# Patient Record
Sex: Male | Born: 1950 | Race: White | Hispanic: No | Marital: Single | Smoking: Current every day smoker
Health system: Southern US, Community
[De-identification: ages and names within clinical notes are randomized; demographics above are authoritative.]

## PROBLEM LIST (undated history)

## (undated) DIAGNOSIS — D0430 Carcinoma in situ of skin of unspecified part of face: Secondary | ICD-10-CM

## (undated) HISTORY — PX: OTHER SURGICAL HISTORY: SHX169

---

## 2004-08-13 ENCOUNTER — Inpatient Hospital Stay (HOSPITAL_COMMUNITY): Admission: EM | Admit: 2004-08-13 | Discharge: 2004-08-17 | Payer: Self-pay | Admitting: Emergency Medicine

## 2005-01-27 ENCOUNTER — Emergency Department (HOSPITAL_COMMUNITY): Admission: EM | Admit: 2005-01-27 | Discharge: 2005-01-28 | Payer: Self-pay | Admitting: Emergency Medicine

## 2010-03-29 ENCOUNTER — Emergency Department (HOSPITAL_COMMUNITY): Admission: EM | Admit: 2010-03-29 | Discharge: 2010-03-29 | Payer: Self-pay | Admitting: Emergency Medicine

## 2011-03-15 LAB — URINALYSIS, ROUTINE W REFLEX MICROSCOPIC
Glucose, UA: NEGATIVE mg/dL
Ketones, ur: 15 mg/dL — AB
Protein, ur: 100 mg/dL — AB
pH: 5 (ref 5.0–8.0)

## 2011-03-15 LAB — URINE MICROSCOPIC-ADD ON

## 2011-05-12 NOTE — Discharge Summary (Signed)
NAMEADRION, Randall Bauer                           ACCOUNT NO.:  [id number]   MEDICAL RECORD NO.:  [id number]                   PATIENT TYPE:  INP   LOCATION:  2004                                 FACILITY:  [facility]   PHYSICIAN:  Vesta Mixer, M.D.              DATE OF BIRTH:  14-Sep-1951   DATE OF ADMISSION:  08/13/2004  DATE OF DISCHARGE:  08/17/2004                                 DISCHARGE SUMMARY   DISCHARGE DIAGNOSES:  1. Acute anterior wall myocardial infarction.  2. 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  3. Bradycardia.   DISCHARGE MEDICATIONS:  1. Plavix 75 mg a day.  2. Nitroglycerin 0.4 mg sublingually as needed.  3. Lisinopril 5 mg a day.  4. Inspra 25 mg a day.  5. Enteric-coated aspirin 81 mg a day.  6. Coumadin 5 mg a day.   DISPOSITION:  The patient will see Dr. Elease Hashimoto in the office on Friday for a  pro time and a BMP.  He will see Dr. Elease Hashimoto in the office in one week from  now for a repeat pro time.   HISTORY:  Randall Bauer is a 60 year old gentleman who was admitted to the  hospital on August 13, 2004, with an acute anterior wall myocardial  infarction that had actually started several days previously.  Please see  dictated History and Physical for further details.   HOSPITAL COURSE:  CORONARY ARTERY DISEASE:  The patient was taken to the  catheterization lab and was found to have an occluded left anterior  descending artery.  He underwent successful PTCA and stenting of his left  anterior descending artery using a 3.0 x 20 mm Taxus.  The stent was post  dilated using a 3.0 Quantum Monorail.  We were successful in opening up the  obstruction to 0% stenosis.  The patient did quite well.  The left  ventriculogram did reveal an apical aneurysm.  He was started on Lovenox,  heparin, and Coumadin at that time.  The patient did quite well.  He was  also started on Inspra 25 mg a day.  We started him  initially on metoprolol  25 mg p.o. b.i.d., but the metoprolol was held because of his persistent  bradycardia.  We will anticipate restarting the metoprolol as an outpatient.   At the time of discharge, his INR is 1.5.  he will be discharged on the  above-noted medications.  We will check a pro time in two days.                                                Vesta Mixer, M.D.    PJN/MEDQ  D:  08/17/2004  T:  08/17/2004  Job:  [id number] 

## 2011-05-12 NOTE — Cardiovascular Report (Signed)
Randall Bauer, Randall Bauer               ACCOUNT NO.:  [id number]   MEDICAL RECORD NO.:  [id number]          PATIENT TYPE:  INP   LOCATION:  2004                         FACILITY:  [facility]   PHYSICIAN:  Vesta Mixer, M.D. DATE OF BIRTH:  1951/11/26   DATE OF PROCEDURE:  08/13/2004  DATE OF DISCHARGE:  08/17/2004                              CARDIAC CATHETERIZATION   INDICATIONS FOR PROCEDURE:  The patient is a 60 year old gentleman who  presented to the emergency room with acute onset of chest pain.  He had ST  segment elevation in the anterior leads consistent with an anterior wall  myocardial infarction.  He was brought to the catheterization lab for  emergent heart catheterization and angioplasty.   DESCRIPTION OF PROCEDURE:  The right femoral artery was easily cannulated  using the modified Seldinger technique.   HEMODYNAMICS:  LV pressure was 141/27.  The aortic pressure was 137/73.   ANGIOGRAPHY:  1.  Left main.  The left main was smooth and normal.  2.  Left anterior descending artery had a 99% stenosis just after the first      diagonal vessel.  There was a TIMI 1 flow down the LAD.  There was mild      irregularities in the diagonal branch between 20 and 25%.  3.  Left circumflex artery is a moderate size vessel.  This was fairly      tortuous.  It gives two large obtuse marginal branches, both of which      are normal.  4.  The right coronary artery is moderate to large in size.  It is dominant.      It gives off a fairly large posterior descending artery and a moderate      size posterolateral segment artery.  There are minor luminal      irregularities in the distal right coronary artery.  5.  Left ventriculogram was performed in a 30 RAO position.  It reveals      anterior apical akinesis/dyskinesis.  The ejection fraction is      approximately 30%.   PTCA:  The 6 French sheath was traded out for a 7 [place] sheath.  The left  main was engaged using a Judkins left 4, 7  [place] guide.  The patient  received IV heparin followed by a double bolus Integrilin drip.   A BMW wire was placed down into the distal left anterior descending artery.  This was followed by a 3.0 x 15 mm Quantum Monorail.  A total of five  inflations were performed at the site of the lesion:  3 atmospheres for 16  seconds, 8 atmospheres for 29 seconds, 8 atmospheres for 29 seconds, 10  atmospheres for 30 seconds, and 8 atmospheres for 21 seconds.  This resulted  in significant improvement of the vessel lumen.  At this point, a 3.0 x 20  mm TAXUS stent was positioned across the affected area.  It was deployed at  15 atmospheres for 40 seconds, a second inflation of 16 atmospheres for 30  seconds was performed.  This resulted in a marked improvement of  the vessel  lumen.  Post stent dilatation was achieved using the 3.0 x 15 mm Quantum  Maverick.  We positioned it inside the stent and inflated it twice up to 20  atmospheres, once in the distal end and once in the proximal end of the  stent.  This resulted in a very nice angiographic lumen.   COMPLICATIONS:  None.   CONCLUSION:  1.  Successful PTCA and stenting of the proximal left anterior descending      artery.  2.  He has significant left ventricular dysfunction with an anterior apical      akinesis/dyskinesis.  He will need to be on Coumadin for several months.       PJN/MEDQ  D:  12/05/2004  T:  12/05/2004  Job:  [id number]

## 2011-05-12 NOTE — H&P (Signed)
NAMEDAEMIAN, GAHM NO.:  [id number]   MEDICAL RECORD NO.:  [id number]          PATIENT TYPE:  INP   LOCATION:  2004                         FACILITY:  [facility]   PHYSICIAN:  Vesta Mixer, M.D. DATE OF BIRTH:  1951/04/23   DATE OF ADMISSION:  08/13/2004  DATE OF DISCHARGE:  08/17/2004                                HISTORY & PHYSICAL   Mr. Trulson is a 60 year old gentleman with no significant past medical  history.  He was transferred from [facility] with some episodes of  chest pain.  He had an EKG that was consistent with an anterior wall  myocardial infarction.   The patient stated that he had intermittent chest pain starting two weeks  ago.  The pain gradually worsened.  Yesterday morning the patient had severe  pain, and then this morning the pain became fairly constant.  He presented  to the [facility] and was found to have an ST segment elevation in  the anterior leads.  He was transferred to [facility] for further  evaluation.  The patient's chest pain is described as mid sternal and is  chest heaviness.  It radiates down to his left arm.   CURRENT MEDICATIONS:  None.   ALLERGIES:  None.   PAST MEDICAL HISTORY:  None.   SOCIAL HISTORY:  The patient smokes one pack a day.   FAMILY HISTORY:  Positive for coronary artery disease.   REVIEW OF SYSTEMS:  The patient has been relatively healthy.  Otherwise, the  Review of Systems is as noted in the HPI.   PHYSICAL EXAMINATION:  GENERAL:  He is a middle-aged gentleman in mild  distress.  VITAL SIGNS:  Heart rate was in the 80s and then came down to the 60s after  IV Lopressor. Blood pressure initially was 160/90 and then later came down  to 130/70 after IV Lopressor.  NECK:  He had 2+ carotids.  He had no JVD.  LUNGS:  Clear.  HEART:  Regular rate, S1, S2.  He had no S3 gallop.  ABDOMEN:  Good bowel sounds and was nontender.  There was no evidence of an  aneurysm.  EXTREMITIES:  He has good pulses.  He had no edema.  NEUROLOGIC:  Exam was nonfocal.   LABORATORY AND X-RAY DATA:  EKG revealed ST segment elevation in the  anteroseptal leads.  He did have small Q waves.   Laboratory data is pending.   IMPRESSION:  1.  Acute anteroseptal myocardial infarction.  The patient presents with      several days of unstable angina.  He developed constant chest pain      earlier this morning and now is found to have changes consistent with an      anterior wall myocardial infarction.   PLAN:  I have recommended that we proceed with an urgent heart  catheterization with possible angioplasty.  We have discussed the risks,  benefits, and options.  He understands and agrees to proceed.       PJN/MEDQ  D:  11/15/2004  T:  11/15/2004  Job:  [id number] 

## 2016-12-25 DIAGNOSIS — D0430 Carcinoma in situ of skin of unspecified part of face: Secondary | ICD-10-CM

## 2016-12-25 HISTORY — DX: Carcinoma in situ of skin of unspecified part of face: D04.30

## 2017-11-18 ENCOUNTER — Other Ambulatory Visit: Payer: Self-pay

## 2017-11-18 ENCOUNTER — Encounter (HOSPITAL_COMMUNITY): Payer: Self-pay | Admitting: *Deleted

## 2017-11-18 ENCOUNTER — Emergency Department (HOSPITAL_COMMUNITY)
Admission: EM | Admit: 2017-11-18 | Discharge: 2017-11-18 | Disposition: A | Discharge status: Home / Self Care | Payer: Medicare Other | Attending: Emergency Medicine | Admitting: Emergency Medicine

## 2017-11-18 ENCOUNTER — Emergency Department (HOSPITAL_COMMUNITY): Payer: Medicare Other

## 2017-11-18 DIAGNOSIS — F17210 Nicotine dependence, cigarettes, uncomplicated: Secondary | ICD-10-CM | POA: Diagnosis not present

## 2017-11-18 DIAGNOSIS — G8929 Other chronic pain: Secondary | ICD-10-CM | POA: Insufficient documentation

## 2017-11-18 DIAGNOSIS — M545 Low back pain: Secondary | ICD-10-CM | POA: Diagnosis present

## 2017-11-18 DIAGNOSIS — M5442 Lumbago with sciatica, left side: Secondary | ICD-10-CM | POA: Insufficient documentation

## 2017-11-18 MED ORDER — ACETAMINOPHEN 500 MG PO TABS: 1000.0000 mg | ORAL_TABLET | Freq: Once | ORAL | Status: DC

## 2017-11-18 MED ORDER — ACETAMINOPHEN 325 MG PO TABS
650.0000 mg | ORAL_TABLET | Freq: Once | ORAL | Status: AC
  Administered 2017-11-18: 650 mg via ORAL
  Filled 2017-11-18: qty 2

## 2017-11-18 MED ORDER — OXYCODONE HCL 5 MG PO TABS: 5.0000 mg | ORAL_TABLET | Freq: Four times a day (QID) | ORAL | 0 refills | Status: AC | PRN

## 2017-11-18 MED ORDER — OXYCODONE-ACETAMINOPHEN 5-325 MG PO TABS
1.0000 | ORAL_TABLET | Freq: Once | ORAL | Status: AC
  Administered 2017-11-18: 1 via ORAL
  Filled 2017-11-18: qty 1

## 2017-11-18 NOTE — ED Triage Notes (Addendum)
Pt states was seen at [place] for lower back pain, L sided.  Works at an [employer], but does not remember a specific injury.  Took prednisone and baclofen without relief.  Denies changes in bowel or bladder habits numbness. Is upset that no x-ray was taken at [facility].

## 2017-11-18 NOTE — Discharge Instructions (Signed)
I suspect your back pain is from disc degenerative changes and arthritis, this could be inflaming your sciatic nerve on the left side. Continue taking baclofen and prednisone as prescribed.  For mild/moderate pain take 1000 mg of Tylenol +600 mg of ibuprofen every 8 hours. For severe or breakthrough pain you can add 5 mg of oxycodone. You may find lidocaine patches helpful. Use heating pad. Rest and avoid any exacerbating activity for the next 2-3 days. Follow-up with orthopedist or primary care doctor in 1 week for reevaluation. If you may need an MRI for further evaluation.  Oxycodone is a narcotic pain medication that has risk of overdose, death, dependence and abuse. Mild and expected side effects include nausea, stomach upset, drowsiness, constipation. Do not consume alcohol, drive or use heavy machinery while taking this medication. Do not leave unattended around children. Flush any remaining pills that you do not use and do not share.  The emergency department has a strict policy regarding prescription of narcotic medications. We are unable to refill this medication in the emergency department for chronic pain. Contact your primary care provider or specialist for chronic pain management and refill on narcotic medications.   Return to the ED via develop fevers, chills, abdominal pain, groin pain, bladder/bowel incontinence or retention, numbness or weakness to her extremities.  See your x-ray results below   EXAM:  LUMBAR SPINE - COMPLETE 4+ VIEW     COMPARISON:  03/29/2010     FINDINGS:  There is no evidence of lumbar spine fracture. Alignment is normal.  There are discogenic degenerative changes, most prominent at L5-S1  with facet arthropathy. Otherwise, intervertebral disc spaces and  vertebral body heights are maintained.     IMPRESSION:  Discogenic degenerative changes without acute fracture or  subluxation.        Electronically Signed    By: Kristopher Oppenheim M.D.    On:  11/18/2017 13:03

## 2017-11-18 NOTE — ED Notes (Signed)
To x-ray

## 2017-11-18 NOTE — ED Provider Notes (Signed)
[place] EMERGENCY DEPARTMENT Provider Note   CSN: [id number] Arrival date & time: 11/18/17  1035     History   Chief Complaint Chief Complaint  Patient presents with  . Back Pain    HPI Randall Bauer is a 66 y.o. male presents with melanoma on cheek s/p excision presents to ED for evaluation of gradually worsening, non radiating, intermittent left lower back pain x 3 weeks. Pain is worse with sneezing, coughing, bending, sitting up and weight bearing. Was seen in [facility] three days ago for this, given baclofen and prednisone which have not helped, pain is getting worse. No x-rays done at [facility]. He denies fall, injury, previous surgeries to the area. Denies fevers, abdominal pain, urinary symptoms, bladder/bowel incontinence or retention, numbness to his extremities, groin or testicular pain. Has history of kidney stones in the past but states this does not feel the same.  HPI  Past Medical History:  Diagnosis Date  . Melanoma ([place]) 2018    There are no active problems to display for this patient.   Past Surgical History:  Procedure Laterality Date  . melanoma removal      OB History    No data available       Home Medications    Prior to Admission medications   Medication Sig Start Date End Date Taking? Authorizing Provider  oxyCODONE (ROXICODONE) 5 MG immediate release tablet Take 1 tablet (5 mg total) by mouth every 6 (six) hours as needed for up to 3 days for severe pain. 11/18/17 11/21/17  Kinnie Feil, PA-C    Family History No family history on file.  Social History Social History   Tobacco Use  . Smoking status: Current Every Day Smoker    Packs/day: 0.50    Types: Cigarettes  . Smokeless tobacco: Never Used  Substance Use Topics  . Alcohol use: No    Frequency: Never  . Drug use: No     Allergies   Patient has no known allergies.   Review of Systems Review of Systems  Musculoskeletal: Positive for back pain, gait  problem and myalgias.  All other systems reviewed and are negative.    Physical Exam Updated Vital Signs BP 136/86 (BP Location: Right Arm)   Pulse 76   Temp 98.7 F (37.1 C) (Oral)   Resp 16   Ht 5\' 10"  (1.778 m)   Wt 91.6 kg (202 lb)   SpO2 94%   BMI 28.98 kg/m   Physical Exam  Constitutional: She is oriented to person, place, and time. She appears well-developed and well-nourished. No distress.  HENT:  Head: Normocephalic and atraumatic.  Nose: Nose normal.  Eyes: EOM are normal.  Neck:  No midline cervical spine tenderness  Cardiovascular: Normal rate, S1 normal, S2 normal and normal heart sounds.  Pulses:      Radial pulses are 2+ on the right side, and 2+ on the left side.       Dorsalis pedis pulses are 2+ on the right side, and 2+ on the left side.  2+ DP, PT and radial pulses bilaterally  Pulmonary/Chest: Effort normal and breath sounds normal. She has no decreased breath sounds.  Abdominal: Soft. Normal appearance and bowel sounds are normal. There is no tenderness.  No suprapubic or CVA tenderness  Abdomen soft NTND  Musculoskeletal: She exhibits tenderness.       Lumbar back: She exhibits tenderness and pain.       Back:  No CTL spine midline  tenderness +Tenderness to left lumbar paraspinal muscles, SI joint and sciatic notch Full PROM hip bilaterally without reported pain Negative SLR. Negative Faber. Negative Stinchfield test.   Neurological: She is alert and oriented to person, place, and time.  5/5 strength with flexion/extension of hip, knee and ankle, bilaterally.  Sensation to light touch intact in lower extremities including feet 2+ patellar reflexes bilaterally  Skin: Skin is warm and dry. Capillary refill takes less than 2 seconds.  Psychiatric: She has a normal mood and affect. Her speech is normal and behavior is normal. Judgment and thought content normal. Cognition and memory are normal.  Nursing note and vitals reviewed.    ED Treatments  / Results  Labs (all labs ordered are listed, but only abnormal results are displayed) Labs Reviewed - No data to display  EKG  EKG Interpretation None       Radiology Dg Lumbar Spine Complete  Result Date: 11/18/2017 CLINICAL DATA:  66 year old male with lower back and right hip pain, worsening for 3 weeks. EXAM: LUMBAR SPINE - COMPLETE 4+ VIEW COMPARISON:  03/29/2010 FINDINGS: There is no evidence of lumbar spine fracture. Alignment is normal. There are discogenic degenerative changes, most prominent at L5-S1 with facet arthropathy. Otherwise, intervertebral disc spaces and vertebral body heights are maintained. IMPRESSION: Discogenic degenerative changes without acute fracture or subluxation. Electronically Signed   By: Kristopher Oppenheim M.D.   On: 11/18/2017 13:03   Dg Hip Unilat W Or Wo Pelvis 2-3 Views Right  Result Date: 11/18/2017 CLINICAL DATA:  66 year old male with lower back and right hip pain for 3 weeks, worsening. EXAM: DG HIP (WITH OR WITHOUT PELVIS) 2-3V RIGHT COMPARISON:  None. FINDINGS: There is no evidence of hip fracture or dislocation. There are mild degenerative changes. No other focal bone abnormality. Visualized soft tissue structures and visceral contours grossly unremarkable. IMPRESSION: No acute fracture or dislocation. Electronically Signed   By: Kristopher Oppenheim M.D.   On: 11/18/2017 13:01    Procedures Procedures (including critical care time)  Medications Ordered in ED Medications  oxyCODONE-acetaminophen (PERCOCET/ROXICET) 5-325 MG per tablet 1 tablet (1 tablet Oral Given 11/18/17 1155)  acetaminophen (TYLENOL) tablet 650 mg (650 mg Oral Given 11/18/17 1155)     Initial Impression / Assessment and Plan / ED Course  I have reviewed the triage vital signs and the nursing notes.  Pertinent labs & imaging results that were available during my care of the patient were reviewed by me and considered in my medical decision making (see chart for  details).    Patient is a 66 y.o. male presents to the ED with left lower back pain.  On exam pt has VSS, abdominal exam reassuring without suprapubic or CVAT. Distal pulses symmetric bilaterally.  Musculoskeletal exam revealed focal left lumbar paraspinal muscle and sciatic notch tenderness. Negative SLR.    Considered kidney stone, cauda equina or epidural abscess however these don't fit clinical picture. Most likely lumbar strain vs spasm vs DDD vs sciatica. No bladder/bowel incontinence or retention, night sweats, night pain, fevers or weight loss, IVDU, recent trauma or falls, urinary symptoms. Lab work not indicated today. Imaging not indicated today as physical exam reassuring.   X-ray show DDD and arthropathy.   Final Clinical Impressions(s) / ED Diagnoses    Pt had adequate control of pain with percocet and APAP in ED. He is ambulatory. Discussed x-ray with pt and family. He will be d/c with prednisone, high dose NSAIDs, oxy, muscle relaxer, lidocaine patches for multi  modal pain control. He is to f/u with ortho in 1 week for re-eval. Discussed return precautions. Final diagnoses:  Chronic left-sided low back pain with left-sided sciatica    ED Discharge Orders        Ordered    oxyCODONE (ROXICODONE) 5 MG immediate release tablet  Every 6 hours PRN     11/18/17 1331       Kinnie Feil, [place] 11/18/17 1336    Julianne Rice, MD 11/18/17 [phone]

## 2017-11-22 ENCOUNTER — Ambulatory Visit (INDEPENDENT_AMBULATORY_CARE_PROVIDER_SITE_OTHER): Payer: Medicare Other | Admitting: Family

## 2017-11-22 ENCOUNTER — Encounter (INDEPENDENT_AMBULATORY_CARE_PROVIDER_SITE_OTHER): Payer: Self-pay | Admitting: Family

## 2017-11-22 DIAGNOSIS — M545 Low back pain, unspecified: Secondary | ICD-10-CM

## 2017-11-22 MED ORDER — OXYCODONE-ACETAMINOPHEN 5-325 MG PO TABS: 1.0000 | ORAL_TABLET | Freq: Four times a day (QID) | ORAL | 0 refills | Status: DC | PRN

## 2017-11-22 MED ORDER — NAPROXEN 500 MG PO TABS: 500.0000 mg | ORAL_TABLET | Freq: Two times a day (BID) | ORAL | 0 refills | Status: AC | PRN

## 2017-11-22 NOTE — Progress Notes (Signed)
Office Visit Note   Patient: Randall Bauer           Date of Birth: 05-30-1951           MRN: [id number] Visit Date: 11/22/2017              Requested by: No referring provider defined for this encounter. PCP: Patient, No Pcp Per  Chief Complaint  Patient presents with  . Lower Back - Pain      HPI: Patient is a 66 year old gentleman who presents today complaining of 5-week history of left-sided low back pain.  He has been seen by's estimated urgent care as well as the emergency department has tried muscle relaxers and 600 mg of ibuprofen with minimal relief.  Visit to the emergency department was prescribed prednisone as well as oxycodone.  Has had minimal relief with the prednisone.  After completing course had return of pain.  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  Had difficulty sleeping last night due to pain denies any numbness tingling weakness. has only had pain down his left leg on one occurrence.   Assessment & Plan: Visit Diagnoses: No diagnosis found.  Plan: Follow up in office in 4 weeks to reevaluate. Will order PT. Refill of Percocet for once occurrence. Will also have him take 2 weeks of Naproxen.   Follow-Up Instructions: No Follow-up on file.   Back Exam   Tenderness  The patient is experiencing no tenderness.   Range of Motion  The patient has normal back ROM.  Muscle Strength  The patient has normal back strength.  Tests  Straight leg raise right: negative Straight leg raise left: negative  Other  Gait: normal   Comments:  No spinous process tenderness. Left sided low back soft tissue tenderness.      Patient is alert, oriented, no adenopathy, well-dressed, normal affect, normal respiratory effort.   Imaging: No results found. No images are attached to the encounter.  Labs: No results  found for: HGBA1C, ESRSEDRATE, CRP, LABURIC, REPTSTATUS, GRAMSTAIN, CULT, LABORGA  @LABSALLVALUES (HGBA1)@  @BMI1 @  Orders:  No orders of the defined types were placed in this encounter.  Meds ordered this encounter  Medications  . oxyCODONE-acetaminophen (ROXICET) 5-325 MG tablet    Sig: Take 1 tablet by mouth every 6 (six) hours as needed for severe pain.    Dispense:  30 tablet    Refill:  0  . naproxen (NAPROSYN) 500 MG tablet    Sig: Take 1 tablet (500 mg total) by mouth 2 (two) times daily as needed for mild pain or moderate pain. With meals    Dispense:  60 tablet    Refill:  0     Procedures: No procedures performed  Clinical Data: No additional findings.  ROS:  All other systems negative, except as noted in the HPI. Review of Systems  Constitutional: Negative for chills and fever.  Musculoskeletal: Positive for back pain and myalgias.  Neurological: Negative for weakness and numbness.    Objective: Vital Signs: There were no vitals taken for this visit.  Specialty Comments:  No specialty comments available.  PMFS History: There are no active problems to display for this patient.  Past Medical History:  Diagnosis Date  . Melanoma ([place]) 2018    No family history on file.  Past Surgical History:  Procedure Laterality Date  . melanoma removal     Social History   Occupational History  . Not on file  Tobacco Use  . Smoking status: Current Every Day Smoker    Packs/day: 0.50    Types: Cigarettes  . Smokeless tobacco: Never Used  Substance and Sexual Activity  . Alcohol use: No    Frequency: Never  . Drug use: No  . Sexual activity: Not on file

## 2017-11-27 ENCOUNTER — Ambulatory Visit: Payer: Medicare Other | Attending: Family | Admitting: Physical Therapy

## 2017-11-27 DIAGNOSIS — M545 Low back pain, unspecified: Secondary | ICD-10-CM

## 2017-11-27 DIAGNOSIS — M6281 Muscle weakness (generalized): Secondary | ICD-10-CM | POA: Diagnosis present

## 2017-11-27 DIAGNOSIS — R262 Difficulty in walking, not elsewhere classified: Secondary | ICD-10-CM

## 2017-11-27 NOTE — Patient Instructions (Signed)
Jari Dipasquale PT [facility] [address], [address] [place], [place] [address] Phone # [phone] Fax [phone]    

## 2017-11-27 NOTE — Therapy (Addendum)
[facility] [address], [place], [place], [address] Phone: [phone]   Fax:  [phone]  Physical Therapy Evaluation/Discharge Summary  Patient Details  Name: Randall Bauer MRN: [id number] Date of Birth: 03/17/1951 Referring Provider: Dondra Prader NP   Encounter Date: 11/27/2017  PT End of Session - 11/27/17 1933    Visit Number  1    Number of Visits  10    Date for PT Re-Evaluation  01/22/18    Authorization Type  [employer] Medicare:  G codes at visit 10; [facility] at visit 15    PT Start Time  1615    PT Stop Time  1705    PT Time Calculation (min)  50 min    Activity Tolerance  Patient limited by pain       Past Medical History:  Diagnosis Date  . Melanoma ([place]) 2018    Past Surgical History:  Procedure Laterality Date  . melanoma removal      There were no vitals filed for this visit.   Subjective Assessment - 11/27/17 1624    Subjective  5-6 weeks ago LBP started after moving and lifting a TV set in an awkward fashion.  Pain immediately radiated left LE to toes.  Went to ER b/c I could barely walk.  Today is a little different b/c the pain has switched sides to the right.  Patient states he has not been eating much lately.  Difficulty sleeping.  Patient's voice is very hoarse.  He reports he has had chest congestion about a week.      Pertinent History  no previous hx    Limitations  House hold activities;Walking;Sitting    How long can you sit comfortably?  20 min    How long can you walk comfortably?  here to the door    Diagnostic tests  none     Patient Stated Goals  be able to walk without pain;  sleep through the night    Currently in Pain?  Yes    Pain Score  4     Pain Location  Back    Pain Orientation  Right;Left    Pain Type  Acute pain    Pain Radiating Towards  left LE to toes    Pain Onset  More than a month ago    Pain Frequency  Constant    Aggravating Factors    walking, sitting,  coughing, bending forward, sit to stand, turning in bed    Pain Relieving Factors  oxycodone; sit in my car         [facility] PT Assessment - 11/27/17 0001      Assessment   Medical Diagnosis  acute left sided low back pain without sciatica    Referring Provider  Dondra Prader NP    Onset Date/Surgical Date  -- 5-6 weeks    Next MD Visit  not scheduled      Precautions   Precautions  None      Restrictions   Weight Bearing Restrictions  No      Balance Screen   Has the patient fallen in the past 6 months  No    Has the patient had a decrease in activity level because of a fear of falling?   No    Is the patient reluctant to leave their home because of a fear of falling?   No      Home Film/video editor residence  Living Arrangements  Spouse/significant other    Type of [place] to enter    Entrance Stairs-Number of Steps  2    Home Layout  One level      Prior Function   Level of Independence  Independent with basic ADLs    Vocation  Full time employment    Vocation Requirements  [employer] lot of walking, getting in/out of cars    Leisure  just work; video games      Observation/Other Assessments   Focus on Therapeutic Outcomes (FOTO)   70% limitation       Posture/Postural Control   Posture/Postural Control  Postural limitations    Postural Limitations  Decreased lumbar lordosis      AROM   AROM Assessment Site  -- possible directional preference wtih extension in lying    Lumbar Flexion  5 pain    Lumbar Extension  10    Lumbar - Right Side Bend  20    Lumbar - Left Side Bend  10 pain      Strength   Strength Assessment Site  -- LE strength WFLs and symmetrical    Lumbar Flexion  4/5    Lumbar Extension  4/5      Flexibility   Soft Tissue Assessment /Muscle Length  yes    Hamstrings  70 degrees bil      Palpation   Palpation comment  right gluteal/piriformis      Slump test   Findings  Negative       Prone Knee Bend Test   Findings  Negative      Straight Leg Raise   Findings  Positive    Side   Right      Ambulation/Gait   Gait Comments  slow rise with sit to stand;  slow antalgic gait on arrival              Objective measurements completed on examination: See above findings.              PT Education - 11/27/17 1929    Education provided  Yes    Education Details  trial of extension every 2 hours; frequent change of position; avoid excessive flexion    Person(s) Educated  Patient    Methods  Explanation;Demonstration;Handout    Comprehension  Returned demonstration;Verbalized understanding       PT Short Term Goals - 11/27/17 2100      PT SHORT TERM GOAL #1   Title  The patient will demonstrate understanding of basic self care strategies for pain control and centralization of symptoms    Time  4    Target Date  12/25/17      PT SHORT TERM GOAL #2   Title  The patient will report a 30% improvement in back and LE symptoms    Time  4    Period  Weeks    Status  New      PT SHORT TERM GOAL #3   Title  The patient will be able to walk 300 feet for community and work ambulation     Time  4    Period  Weeks    Status  New        PT Long Term Goals - 11/27/17 2102      PT LONG TERM GOAL #1   Title  The patient will be independent in safe self progression of HEP  Time  8    Period  Weeks    Status  New    Target Date  01/22/18      PT LONG TERM GOAL #2   Title  The patient will report a 60% improvement in back and LE symptoms with usual ADLs including sit to stand, turning in bed    Time  8    Period  Weeks    Status  New      PT LONG TERM GOAL #3   Title  The patient will have improved lumbar ROM for flexion, extension and sidebending WFLs needed to get in/out of the car frequently needed for work     Time  8    Period  Weeks    Status  New      PT LONG TERM GOAL #4   Title  The patient will be able to walk 500 feet needed for work  duties    Time  8    Period  Weeks    Status  New      PT LONG TERM GOAL #5   Title  FOTO functional outcome score improved from 70% limitation to 44% indicating improved function with less pain    Time  8    Period  Weeks    Status  New             Plan - 11/27/17 1933    Clinical Impression Statement  The patient arrives moving slowly and antalgically.  He has a 5-6 week history of left low back pain which he states radiated down his left leg.  He reports his symptoms are different today b/c his pain has switched to the right side.  His symptoms began when he was moving and he lifted a TV awkwardly.  His pain is aggravated with walking, bending forward, sit to stand, prolonged sitting and turning in bed affecting his sleep.  He reports he is better with taking oxycodone.  Painful and limited lumbar ROM especially flexion and left sidebending.  Repeated movement testing indicates a possible preference for extension in lying.  Decreased lumbar lordosis.  + right SLR but other neural tension signs -.   He has been unable to work at his job at [employer] where he does a lot of walking and driving.   He would benefit from PT for pain control and to restore normal movement and function.  Following session, the patient is able to rise from sit to stand with greater ease and walk upright at a normal speed.   Recommend 2x/week however the patient states he cannot afford his high co-pay and requests 1x/week.      History and Personal Factors relevant to plan of care:  no co-morbidities; no previous history    Clinical Presentation  Evolving    Clinical Presentation due to:  distal and changing symptoms    Clinical Decision Making  Low    Rehab Potential  Good    Clinical Impairments Affecting Rehab Potential  none    PT Frequency  2x / week patient requests 1x/week for financial reasons    PT Duration  8 weeks    PT Treatment/Interventions  ADLs/Self Care Home Management;Neuromuscular  re-education;Dry needling;Moist Heat;Manual techniques;Taping;Patient/family education;Traction;Ultrasound;Therapeutic activities;Therapeutic exercise;Electrical Stimulation;Cryotherapy    PT Next Visit Plan  assess response to extension trial and progress per [place] system;   electrical stimulation/moist heat for pain control;  manual therapy; mobility exercise       Patient will  benefit from skilled therapeutic intervention in order to improve the following deficits and impairments:  Pain, Postural dysfunction, Decreased range of motion, Decreased strength, Difficulty walking, Decreased activity tolerance  Visit Diagnosis: Acute bilateral low back pain without sciatica - Plan: PT plan of care cert/re-cert  Difficulty in walking, not elsewhere classified - Plan: PT plan of care cert/re-cert  Muscle weakness (generalized) - Plan: PT plan of care cert/re-cert  G-Codes - 64/15/83 February 21, 2108    Functional Assessment Tool Used (Outpatient Only)  FOTO; clinical impression    Functional Limitation  Mobility: Walking and moving around    Mobility: Walking and Moving Around Current Status [phone])  At least 60 percent but less than 80 percent impaired, limited or restricted    Mobility: Walking and Moving Around Goal Status [phone])  At least 40 percent but less than 60 percent impaired, limited or restricted       PHYSICAL THERAPY DISCHARGE SUMMARY  Visits from Start of Care: 1  Current functional level related to goals / functional outcomes: The patient did not return after initial evaluation 11/27/17.  Record review indicates he is now deceased.     Remaining deficits:    Education / Equipment:  Plan:                                                    Patient goals were not met.                                                                                                      ?????        Problem List There are no active problems to display for this patient.   Ruben Im,  PT 11/27/17 9:12 PM Phone: [phone] Fax: [phone]  Alvera Singh 11/27/2017, 9:12 PM   [facility] [address] [place], [place], [address] Phone: [phone]   Fax:  [phone]  Name: Homar Weinkauf MRN: [id number] Date of Birth: [birth year]

## 2017-12-07 ENCOUNTER — Ambulatory Visit (INDEPENDENT_AMBULATORY_CARE_PROVIDER_SITE_OTHER): Payer: Medicare Other | Admitting: Family

## 2017-12-07 ENCOUNTER — Telehealth (INDEPENDENT_AMBULATORY_CARE_PROVIDER_SITE_OTHER): Payer: Self-pay | Admitting: Orthopedic Surgery

## 2017-12-07 ENCOUNTER — Encounter (INDEPENDENT_AMBULATORY_CARE_PROVIDER_SITE_OTHER): Payer: Self-pay | Admitting: Family

## 2017-12-07 ENCOUNTER — Ambulatory Visit (INDEPENDENT_AMBULATORY_CARE_PROVIDER_SITE_OTHER): Payer: Self-pay

## 2017-12-07 DIAGNOSIS — W19XXXA Unspecified fall, initial encounter: Secondary | ICD-10-CM | POA: Diagnosis not present

## 2017-12-07 DIAGNOSIS — M545 Low back pain: Secondary | ICD-10-CM | POA: Diagnosis not present

## 2017-12-07 MED ORDER — OXYCODONE-ACETAMINOPHEN 5-325 MG PO TABS: 1.0000 | ORAL_TABLET | Freq: Three times a day (TID) | ORAL | 0 refills | Status: AC | PRN

## 2017-12-07 NOTE — Progress Notes (Signed)
Office Visit Note   Patient: Randall Bauer           Date of Birth: 23-Apr-1951           MRN: [id number] Visit Date: 12/07/2017              Requested by: No referring provider defined for this encounter. PCP: Patient, No Pcp Per  No chief complaint on file.     HPI: Patient is a 66 year old gentleman who presents today for a concern of worsening of back pain. This has been ongoing for nearly 2 months. Did have another fall in his home, mechanical fall, onto his left side. Low back pain on left sometimes radiates to right side. Now having constant aching pain down inner left groin to foot. Feels his right leg is stronger than left.   States laying on his left side improves his pain. trys to arch his back to relieve his pain as well. Has had one visit to PT since our last visit which was minimally helpful per report.   Did take a pred taper starting on 11/29 which helped a little. Feels the Naproxen does not help his radicular symptoms.   Assessment & Plan: Visit Diagnoses:  1. Left low back pain, unspecified chronicity, with sciatica presence unspecified   2. Fall, initial encounter     Plan: Follow up in office in 4 weeks to reevaluate. No red flag symptoms. Offered MRI in hopes to get in for [facility]. Patient declined. One time Rx for Percocet. Discussed if symptoms do not resolve before next appointment will need to proceed with MRI, no refill of narcotics. Encouraged him to continue with PT and Naproxen. Heating pad.   Follow-Up Instructions: Return in about 4 weeks (around 01/04/2018).   Back Exam   Range of Motion  The patient has normal back ROM.  Tests  Straight leg raise right: negative Straight leg raise left: negative  Other  Gait: normal   Comments:  No spinous process tenderness. Left sided low back soft tissue tenderness. Poor effort with quad strength testing.       Patient is alert, oriented, no adenopathy, well-dressed, normal affect, normal respiratory  effort.   Imaging: No results found. No images are attached to the encounter.  Labs: No results found for: HGBA1C, ESRSEDRATE, CRP, LABURIC, REPTSTATUS, GRAMSTAIN, CULT, LABORGA  @LABSALLVALUES (HGBA1)@  @BMI1 @  Orders:  Orders Placed This Encounter  Procedures  . XR Lumbar Spine 2-3 Views   Meds ordered this encounter  Medications  . oxyCODONE-acetaminophen (ROXICET) 5-325 MG tablet    Sig: Take 1 tablet by mouth every 8 (eight) hours as needed for severe pain.    Dispense:  21 tablet    Refill:  0     Procedures: No procedures performed  Clinical Data: No additional findings.  ROS:  All other systems negative, except as noted in the HPI. Review of Systems  Constitutional: Negative for chills and fever.  Musculoskeletal: Positive for back pain and myalgias.  Neurological: Positive for weakness. Negative for numbness.    Objective: Vital Signs: There were no vitals taken for this visit.  Specialty Comments:  No specialty comments available.  PMFS History: There are no active problems to display for this patient.  Past Medical History:  Diagnosis Date  . Melanoma ([place]) 2018    History reviewed. No pertinent family history.  Past Surgical History:  Procedure Laterality Date  . melanoma removal     Social History   Occupational History  .  Not on file  Tobacco Use  . Smoking status: Current Every Day Smoker    Packs/day: 0.50    Types: Cigarettes  . Smokeless tobacco: Never Used  Substance and Sexual Activity  . Alcohol use: No    Frequency: Never  . Drug use: No  . Sexual activity: Not on file

## 2017-12-07 NOTE — Telephone Encounter (Signed)
Med refill  Oxycodone-acetaminophen (Roxicet) 5-325 mg tablet

## 2017-12-22 ENCOUNTER — Encounter (HOSPITAL_COMMUNITY): Payer: Self-pay

## 2017-12-22 ENCOUNTER — Emergency Department (HOSPITAL_COMMUNITY): Payer: Medicare Other

## 2017-12-22 ENCOUNTER — Inpatient Hospital Stay (HOSPITAL_COMMUNITY)
Admission: EM | Admit: 2017-12-22 | Discharge: 2018-01-25 | DRG: 180 | Disposition: E | Payer: Medicare Other | Attending: Internal Medicine | Admitting: Internal Medicine

## 2017-12-22 ENCOUNTER — Inpatient Hospital Stay (HOSPITAL_COMMUNITY): Payer: Medicare Other

## 2017-12-22 ENCOUNTER — Other Ambulatory Visit: Payer: Self-pay

## 2017-12-22 DIAGNOSIS — C7931 Secondary malignant neoplasm of brain: Secondary | ICD-10-CM | POA: Diagnosis present

## 2017-12-22 DIAGNOSIS — E872 Acidosis, unspecified: Secondary | ICD-10-CM

## 2017-12-22 DIAGNOSIS — M8448XA Pathological fracture, other site, initial encounter for fracture: Secondary | ICD-10-CM | POA: Diagnosis present

## 2017-12-22 DIAGNOSIS — F17210 Nicotine dependence, cigarettes, uncomplicated: Secondary | ICD-10-CM | POA: Diagnosis present

## 2017-12-22 DIAGNOSIS — R64 Cachexia: Secondary | ICD-10-CM | POA: Diagnosis present

## 2017-12-22 DIAGNOSIS — T380X5A Adverse effect of glucocorticoids and synthetic analogues, initial encounter: Secondary | ICD-10-CM | POA: Diagnosis not present

## 2017-12-22 DIAGNOSIS — C3402 Malignant neoplasm of left main bronchus: Principal | ICD-10-CM | POA: Diagnosis present

## 2017-12-22 DIAGNOSIS — C7949 Secondary malignant neoplasm of other parts of nervous system: Secondary | ICD-10-CM | POA: Diagnosis present

## 2017-12-22 DIAGNOSIS — Z7189 Other specified counseling: Secondary | ICD-10-CM | POA: Diagnosis not present

## 2017-12-22 DIAGNOSIS — M545 Low back pain, unspecified: Secondary | ICD-10-CM

## 2017-12-22 DIAGNOSIS — Z66 Do not resuscitate: Secondary | ICD-10-CM | POA: Diagnosis present

## 2017-12-22 DIAGNOSIS — E871 Hypo-osmolality and hyponatremia: Secondary | ICD-10-CM | POA: Diagnosis present

## 2017-12-22 DIAGNOSIS — C7951 Secondary malignant neoplasm of bone: Secondary | ICD-10-CM | POA: Diagnosis present

## 2017-12-22 DIAGNOSIS — R0602 Shortness of breath: Secondary | ICD-10-CM | POA: Diagnosis not present

## 2017-12-22 DIAGNOSIS — C799 Secondary malignant neoplasm of unspecified site: Secondary | ICD-10-CM | POA: Diagnosis not present

## 2017-12-22 DIAGNOSIS — I248 Other forms of acute ischemic heart disease: Secondary | ICD-10-CM | POA: Diagnosis present

## 2017-12-22 DIAGNOSIS — Z85820 Personal history of malignant melanoma of skin: Secondary | ICD-10-CM | POA: Diagnosis not present

## 2017-12-22 DIAGNOSIS — J90 Pleural effusion, not elsewhere classified: Secondary | ICD-10-CM

## 2017-12-22 DIAGNOSIS — J439 Emphysema, unspecified: Secondary | ICD-10-CM | POA: Diagnosis present

## 2017-12-22 DIAGNOSIS — E876 Hypokalemia: Secondary | ICD-10-CM | POA: Diagnosis present

## 2017-12-22 DIAGNOSIS — Z515 Encounter for palliative care: Secondary | ICD-10-CM

## 2017-12-22 DIAGNOSIS — C7972 Secondary malignant neoplasm of left adrenal gland: Secondary | ICD-10-CM | POA: Diagnosis present

## 2017-12-22 DIAGNOSIS — C7982 Secondary malignant neoplasm of genital organs: Secondary | ICD-10-CM | POA: Diagnosis present

## 2017-12-22 DIAGNOSIS — J189 Pneumonia, unspecified organism: Secondary | ICD-10-CM

## 2017-12-22 DIAGNOSIS — F411 Generalized anxiety disorder: Secondary | ICD-10-CM

## 2017-12-22 DIAGNOSIS — I2510 Atherosclerotic heart disease of native coronary artery without angina pectoris: Secondary | ICD-10-CM

## 2017-12-22 DIAGNOSIS — M6289 Other specified disorders of muscle: Secondary | ICD-10-CM

## 2017-12-22 DIAGNOSIS — R739 Hyperglycemia, unspecified: Secondary | ICD-10-CM | POA: Diagnosis not present

## 2017-12-22 DIAGNOSIS — I10 Essential (primary) hypertension: Secondary | ICD-10-CM | POA: Diagnosis present

## 2017-12-22 DIAGNOSIS — G893 Neoplasm related pain (acute) (chronic): Secondary | ICD-10-CM | POA: Diagnosis present

## 2017-12-22 DIAGNOSIS — D72829 Elevated white blood cell count, unspecified: Secondary | ICD-10-CM

## 2017-12-22 DIAGNOSIS — J9811 Atelectasis: Secondary | ICD-10-CM

## 2017-12-22 DIAGNOSIS — R778 Other specified abnormalities of plasma proteins: Secondary | ICD-10-CM

## 2017-12-22 DIAGNOSIS — M898X9 Other specified disorders of bone, unspecified site: Secondary | ICD-10-CM

## 2017-12-22 DIAGNOSIS — C7889 Secondary malignant neoplasm of other digestive organs: Secondary | ICD-10-CM | POA: Diagnosis present

## 2017-12-22 DIAGNOSIS — R490 Dysphonia: Secondary | ICD-10-CM | POA: Diagnosis present

## 2017-12-22 DIAGNOSIS — I4892 Unspecified atrial flutter: Secondary | ICD-10-CM | POA: Diagnosis present

## 2017-12-22 DIAGNOSIS — J9601 Acute respiratory failure with hypoxia: Secondary | ICD-10-CM | POA: Diagnosis not present

## 2017-12-22 DIAGNOSIS — E279 Disorder of adrenal gland, unspecified: Secondary | ICD-10-CM

## 2017-12-22 DIAGNOSIS — C7970 Secondary malignant neoplasm of unspecified adrenal gland: Secondary | ICD-10-CM | POA: Diagnosis present

## 2017-12-22 DIAGNOSIS — R1909 Other intra-abdominal and pelvic swelling, mass and lump: Secondary | ICD-10-CM | POA: Diagnosis not present

## 2017-12-22 DIAGNOSIS — R042 Hemoptysis: Secondary | ICD-10-CM | POA: Diagnosis present

## 2017-12-22 DIAGNOSIS — M549 Dorsalgia, unspecified: Secondary | ICD-10-CM | POA: Diagnosis present

## 2017-12-22 DIAGNOSIS — E860 Dehydration: Secondary | ICD-10-CM | POA: Diagnosis present

## 2017-12-22 DIAGNOSIS — M899 Disorder of bone, unspecified: Secondary | ICD-10-CM | POA: Diagnosis not present

## 2017-12-22 DIAGNOSIS — R7989 Other specified abnormal findings of blood chemistry: Secondary | ICD-10-CM

## 2017-12-22 DIAGNOSIS — R918 Other nonspecific abnormal finding of lung field: Secondary | ICD-10-CM | POA: Diagnosis not present

## 2017-12-22 DIAGNOSIS — Z955 Presence of coronary angioplasty implant and graft: Secondary | ICD-10-CM

## 2017-12-22 DIAGNOSIS — D630 Anemia in neoplastic disease: Secondary | ICD-10-CM | POA: Diagnosis present

## 2017-12-22 DIAGNOSIS — Z6828 Body mass index (BMI) 28.0-28.9, adult: Secondary | ICD-10-CM

## 2017-12-22 DIAGNOSIS — J181 Lobar pneumonia, unspecified organism: Secondary | ICD-10-CM | POA: Diagnosis not present

## 2017-12-22 DIAGNOSIS — Z7952 Long term (current) use of systemic steroids: Secondary | ICD-10-CM | POA: Diagnosis not present

## 2017-12-22 DIAGNOSIS — I252 Old myocardial infarction: Secondary | ICD-10-CM

## 2017-12-22 DIAGNOSIS — E278 Other specified disorders of adrenal gland: Secondary | ICD-10-CM

## 2017-12-22 HISTORY — DX: Carcinoma in situ of skin of unspecified part of face: D04.30

## 2017-12-22 LAB — PROTIME-INR
INR: 1.29
PROTHROMBIN TIME: 16 s — AB (ref 11.4–15.2)

## 2017-12-22 LAB — URINALYSIS, ROUTINE W REFLEX MICROSCOPIC
Bilirubin Urine: NEGATIVE
GLUCOSE, UA: 50 mg/dL — AB
Ketones, ur: 20 mg/dL — AB
NITRITE: NEGATIVE
PH: 6 (ref 5.0–8.0)
Protein, ur: 30 mg/dL — AB
Specific Gravity, Urine: 1.036 — ABNORMAL HIGH (ref 1.005–1.030)

## 2017-12-22 LAB — CBC WITH DIFFERENTIAL/PLATELET
BASOS ABS: 0 10*3/uL (ref 0.0–0.1)
Basophils Relative: 0 %
EOS ABS: 0.3 10*3/uL (ref 0.0–0.7)
Eosinophils Relative: 1 %
HCT: 36.7 % — ABNORMAL LOW (ref 39.0–52.0)
HEMOGLOBIN: 12.5 g/dL — AB (ref 13.0–17.0)
LYMPHS PCT: 13 %
Lymphs Abs: 3.3 10*3/uL (ref 0.7–4.0)
MCH: 28.2 pg (ref 26.0–34.0)
MCHC: 34.1 g/dL (ref 30.0–36.0)
MCV: 82.7 fL (ref 78.0–100.0)
Monocytes Absolute: 2.8 10*3/uL — ABNORMAL HIGH (ref 0.1–1.0)
Monocytes Relative: 11 %
NEUTROS ABS: 18.7 10*3/uL — AB (ref 1.7–7.7)
NEUTROS PCT: 75 %
Platelets: 428 10*3/uL — ABNORMAL HIGH (ref 150–400)
RBC: 4.44 MIL/uL (ref 4.22–5.81)
RDW: 14.3 % (ref 11.5–15.5)
WBC: 25.1 10*3/uL — AB (ref 4.0–10.5)

## 2017-12-22 LAB — CREATININE, SERUM
CREATININE: 0.64 mg/dL (ref 0.61–1.24)
GFR calc Af Amer: >60 mL/min (ref >60)

## 2017-12-22 LAB — ABO/RH: ABO/RH(D): O POS

## 2017-12-22 LAB — COMPREHENSIVE METABOLIC PANEL
ALK PHOS: 123 U/L (ref 38–126)
ALT: 34 U/L (ref 17–63)
ANION GAP: 19 — AB (ref 5–15)
AST: 46 U/L — ABNORMAL HIGH (ref 15–41)
Albumin: 2.5 g/dL — ABNORMAL LOW (ref 3.5–5.0)
BILIRUBIN TOTAL: 1.3 mg/dL — AB (ref 0.3–1.2)
BUN: 16 mg/dL (ref 6–20)
CALCIUM: 9.4 mg/dL (ref 8.9–10.3)
CO2: 22 mmol/L (ref 22–32)
Chloride: 89 mmol/L — ABNORMAL LOW (ref 101–111)
Creatinine, Ser: 0.86 mg/dL (ref 0.61–1.24)
GLUCOSE: 169 mg/dL — AB (ref 65–99)
Potassium: 3 mmol/L — ABNORMAL LOW (ref 3.5–5.1)
Sodium: 130 mmol/L — ABNORMAL LOW (ref 135–145)
TOTAL PROTEIN: 7.5 g/dL (ref 6.5–8.1)

## 2017-12-22 LAB — CBC
HCT: 29.8 % — ABNORMAL LOW (ref 39.0–52.0)
HEMOGLOBIN: 10.1 g/dL — AB (ref 13.0–17.0)
MCH: 28.1 pg (ref 26.0–34.0)
MCHC: 33.9 g/dL (ref 30.0–36.0)
MCV: 83 fL (ref 78.0–100.0)
PLATELETS: 220 10*3/uL (ref 150–400)
RBC: 3.59 MIL/uL — AB (ref 4.22–5.81)
RDW: 14.4 % (ref 11.5–15.5)
WBC: 12.8 10*3/uL — ABNORMAL HIGH (ref 4.0–10.5)

## 2017-12-22 LAB — PROCALCITONIN: PROCALCITONIN: 0.83 ng/mL

## 2017-12-22 LAB — I-STAT CG4 LACTIC ACID, ED
LACTIC ACID, VENOUS: 7.05 mmol/L — AB (ref 0.5–1.9)
Lactic Acid, Venous: 1.92 mmol/L — ABNORMAL HIGH (ref 0.5–1.9)

## 2017-12-22 LAB — TROPONIN I: Troponin I: <0.03 ng/mL (ref <0.03)

## 2017-12-22 LAB — TYPE AND SCREEN
ABO/RH(D): O POS
Antibody Screen: NEGATIVE

## 2017-12-22 MED ORDER — ONDANSETRON HCL 4 MG/2ML IJ SOLN
4.0000 mg | Freq: Four times a day (QID) | INTRAMUSCULAR | Status: DC | PRN
  Administered 2017-12-22: 4 mg via INTRAVENOUS
  Filled 2017-12-22: qty 2

## 2017-12-22 MED ORDER — VANCOMYCIN HCL IN DEXTROSE 1-5 GM/200ML-% IV SOLN
1000.0000 mg | Freq: Once | INTRAVENOUS | Status: AC
  Administered 2017-12-22: 1000 mg via INTRAVENOUS
  Filled 2017-12-22: qty 200

## 2017-12-22 MED ORDER — ACETAMINOPHEN 650 MG RE SUPP: 650.0000 mg | Freq: Four times a day (QID) | RECTAL | Status: DC | PRN

## 2017-12-22 MED ORDER — PIPERACILLIN-TAZOBACTAM 3.375 G IVPB
3.3750 g | Freq: Three times a day (TID) | INTRAVENOUS | Status: DC
  Administered 2017-12-22 – 2017-12-23 (×3): 3.375 g via INTRAVENOUS
  Filled 2017-12-22 (×3): qty 50

## 2017-12-22 MED ORDER — DEXAMETHASONE 2 MG PO TABS
4.0000 mg | ORAL_TABLET | Freq: Three times a day (TID) | ORAL | Status: DC
  Administered 2017-12-22 – 2017-12-24 (×8): 4 mg via ORAL
  Filled 2017-12-22 (×8): qty 1

## 2017-12-22 MED ORDER — VANCOMYCIN HCL IN DEXTROSE 1-5 GM/200ML-% IV SOLN
1000.0000 mg | Freq: Two times a day (BID) | INTRAVENOUS | Status: DC
  Administered 2017-12-22 – 2017-12-23 (×2): 1000 mg via INTRAVENOUS
  Filled 2017-12-22 (×3): qty 200

## 2017-12-22 MED ORDER — SODIUM CHLORIDE 0.9 % IV BOLUS (SEPSIS)
1000.0000 mL | Freq: Once | INTRAVENOUS | Status: AC
  Administered 2017-12-22: 1000 mL via INTRAVENOUS

## 2017-12-22 MED ORDER — ENSURE ENLIVE PO LIQD
237.0000 mL | Freq: Two times a day (BID) | ORAL | Status: DC
  Administered 2017-12-22 – 2017-12-24 (×4): 237 mL via ORAL

## 2017-12-22 MED ORDER — FENTANYL CITRATE (PF) 100 MCG/2ML IJ SOLN
100.0000 ug | Freq: Once | INTRAMUSCULAR | Status: AC
  Administered 2017-12-22: 100 ug via INTRAVENOUS
  Filled 2017-12-22: qty 2

## 2017-12-22 MED ORDER — HYDROMORPHONE HCL 1 MG/ML IJ SOLN
1.0000 mg | INTRAMUSCULAR | Status: DC | PRN
  Administered 2017-12-22: 1 mg via INTRAVENOUS
  Filled 2017-12-22: qty 1

## 2017-12-22 MED ORDER — POTASSIUM CHLORIDE CRYS ER 20 MEQ PO TBCR
40.0000 meq | EXTENDED_RELEASE_TABLET | Freq: Once | ORAL | Status: AC
  Administered 2017-12-22: 40 meq via ORAL
  Filled 2017-12-22: qty 2

## 2017-12-22 MED ORDER — IOPAMIDOL (ISOVUE-370) INJECTION 76%
100.0000 mL | Freq: Once | INTRAVENOUS | Status: AC | PRN
  Administered 2017-12-22: 100 mL via INTRAVENOUS

## 2017-12-22 MED ORDER — ENOXAPARIN SODIUM 40 MG/0.4ML ~~LOC~~ SOLN: 40.0000 mg | SUBCUTANEOUS | Status: DC

## 2017-12-22 MED ORDER — IOPAMIDOL (ISOVUE-300) INJECTION 61%
100.0000 mL | Freq: Once | INTRAVENOUS | Status: AC | PRN
  Administered 2017-12-22: 80 mL via INTRAVENOUS

## 2017-12-22 MED ORDER — SODIUM CHLORIDE 0.9 % IV SOLN
INTRAVENOUS | Status: DC
  Administered 2017-12-22: 13:00:00 via INTRAVENOUS

## 2017-12-22 MED ORDER — SODIUM CHLORIDE 0.9 % IJ SOLN
INTRAMUSCULAR | Status: AC
  Administered 2017-12-22: 10:00:00
  Filled 2017-12-22: qty 50

## 2017-12-22 MED ORDER — IOPAMIDOL (ISOVUE-370) INJECTION 76%
INTRAVENOUS | Status: AC
  Filled 2017-12-22: qty 100

## 2017-12-22 MED ORDER — PIPERACILLIN-TAZOBACTAM 3.375 G IVPB 30 MIN
3.3750 g | Freq: Once | INTRAVENOUS | Status: AC
  Administered 2017-12-22: 3.375 g via INTRAVENOUS
  Filled 2017-12-22: qty 50

## 2017-12-22 MED ORDER — HYDROCODONE-ACETAMINOPHEN 7.5-325 MG PO TABS
1.0000 | ORAL_TABLET | Freq: Four times a day (QID) | ORAL | Status: DC
  Administered 2017-12-22 – 2017-12-24 (×11): 1 via ORAL
  Filled 2017-12-22 (×12): qty 1

## 2017-12-22 MED ORDER — ACETAMINOPHEN 325 MG PO TABS: 650.0000 mg | ORAL_TABLET | Freq: Four times a day (QID) | ORAL | Status: DC | PRN

## 2017-12-22 MED ORDER — KETOROLAC TROMETHAMINE 30 MG/ML IJ SOLN
30.0000 mg | Freq: Four times a day (QID) | INTRAMUSCULAR | Status: AC
  Administered 2017-12-22 – 2017-12-25 (×11): 30 mg via INTRAVENOUS
  Filled 2017-12-22 (×11): qty 1

## 2017-12-22 MED ORDER — KETOROLAC TROMETHAMINE 30 MG/ML IJ SOLN: 30.0000 mg | Freq: Four times a day (QID) | INTRAMUSCULAR | Status: DC | PRN

## 2017-12-22 MED ORDER — ONDANSETRON HCL 4 MG PO TABS: 4.0000 mg | ORAL_TABLET | Freq: Four times a day (QID) | ORAL | Status: DC | PRN

## 2017-12-22 NOTE — Progress Notes (Signed)
Nurse in to patient's room to collect nasal swab for MRSA, pt reports that swabs were done by "Nurse Terri" earlier today.

## 2017-12-22 NOTE — ED Triage Notes (Signed)
Pt. Reports back pain "all night". He also reports congested cough with sm. Amt. Blood-tinged phlegm. EMS report normal v.s. And pt. Is wearing a mask and is in no distress.

## 2017-12-22 NOTE — ED Triage Notes (Signed)
He c/o his low back aching "all night-I couldn't sleep". He also tells [place] he has had a minimally productive cough x 2-3 days and occasionally "I see a little blood in it".

## 2017-12-22 NOTE — ED Notes (Signed)
Patient transported to MRI 

## 2017-12-22 NOTE — ED Triage Notes (Signed)
He also tells [place] he fell some two weeks ago "and I've been in bed a lot since then" he states his back pain radiates into left flank and left hip.

## 2017-12-22 NOTE — ED Provider Notes (Signed)
[facility] DEPT Provider Note   CSN: [id number] Arrival date & time: 11/26/2017  8938     History   Chief Complaint Chief Complaint  Patient presents with  . Back Pain  . URI    HPI Randall Bauer is a 66 y.o. male.  HPI 66 year old male presents with back pain and cough.  He has been having low back pain for about a month.  He was seen here on 11/25 and told that he had degenerative disc disease.  Since then he had a fall and now is having worsening pain.  He has been having left leg weakness for a couple weeks as well.  He has had some difficulty urinating but for the most part has been able to urinate.  No incontinence.  However his back pain has been progressively worsening.  He is also had a cough with some mild hemoptysis over the last 3 days.  Had a tactile fever this morning as well.  He denies any abdominal pain.  He has been having some left-sided chest discomfort but thinks is because he has been laying on his left side as this helps relieve the pain in his back.  The pain in his back is mostly left lower in his buttocks and runs down his left leg. Pain is severe.  Past Medical History:  Diagnosis Date  . Melanoma ([facility]) 2018    There are no active problems to display for this patient.   Past Surgical History:  Procedure Laterality Date  . melanoma removal         Home Medications    Prior to Admission medications   Medication Sig Start Date End Date Taking? Authorizing Provider  ibuprofen (ADVIL,MOTRIN) 800 MG tablet Take 800 mg by mouth every 8 (eight) hours as needed for mild pain or moderate pain.   Yes [provider]  naproxen (NAPROSYN) 500 MG tablet Take 1 tablet (500 mg total) by mouth 2 (two) times daily as needed for mild pain or moderate pain. With meals 11/22/17   Suzan Slick, NP  oxyCODONE-acetaminophen (ROXICET) 5-325 MG tablet Take 1 tablet by mouth every 8 (eight) hours as needed for severe pain. 12/07/17    Suzan Slick, NP  predniSONE (DELTASONE) 20 MG tablet TK 3 TS PO TOGETHER AS ONE DOSE FOR 5 DAYS 11/16/17   [provider]    Family History No family history on file.  Social History Social History   Tobacco Use  . Smoking status: Current Every Day Smoker    Packs/day: 0.50    Types: Cigarettes  . Smokeless tobacco: Never Used  Substance Use Topics  . Alcohol use: No    Frequency: Never  . Drug use: No     Allergies   Patient has no known allergies.   Review of Systems Review of Systems  Constitutional: Positive for fever (subjective).  Respiratory: Positive for cough and shortness of breath.   Cardiovascular: Positive for chest pain.  Gastrointestinal: Negative for abdominal pain.  Musculoskeletal: Positive for back pain.  Neurological: Positive for weakness (left leg).  All other systems reviewed and are negative.    Physical Exam Updated Vital Signs BP 126/84   Pulse (!) 107   Temp 98.2 F (36.8 C) (Oral)   Resp (!) 27   SpO2 99%   Physical Exam  Constitutional: He is oriented to person, place, and time. He appears well-developed and well-nourished.  Chronically ill appearing  HENT:  Head: Normocephalic and  atraumatic.  Right Ear: External ear normal.  Left Ear: External ear normal.  Nose: Nose normal.  Eyes: Right eye exhibits no discharge. Left eye exhibits no discharge.  Neck: Neck supple.  Cardiovascular: Normal rate, regular rhythm and normal heart sounds.  Pulmonary/Chest: Effort normal. Tachypnea noted.  Abdominal: Soft. He exhibits no distension. There is no tenderness.  Musculoskeletal: He exhibits no edema.       Lumbar back: He exhibits tenderness.       Back:  Neurological: He is alert and oriented to person, place, and time.  RLE 5/5 strength. Left quadriceps is weak, can barely lift leg up. However his lower leg and foot strength is normal. Normal sensation  Skin: Skin is warm and dry. He is not diaphoretic.  Nursing  note and vitals reviewed.    ED Treatments / Results  Labs (all labs ordered are listed, but only abnormal results are displayed) Labs Reviewed  COMPREHENSIVE METABOLIC PANEL - Abnormal; Notable for the following components:      Result Value   Sodium 130 (*)    Potassium 3.0 (*)    Chloride 89 (*)    Glucose, Bld 169 (*)    Albumin 2.5 (*)    AST 46 (*)    Total Bilirubin 1.3 (*)    Anion gap 19 (*)    All other components within normal limits  CBC WITH DIFFERENTIAL/PLATELET - Abnormal; Notable for the following components:   WBC 25.1 (*)    Hemoglobin 12.5 (*)    HCT 36.7 (*)    Platelets 428 (*)    All other components within normal limits  I-STAT CG4 LACTIC ACID, ED - Abnormal; Notable for the following components:   Lactic Acid, Venous 7.05 (*)    All other components within normal limits  CULTURE, BLOOD (ROUTINE X 2)  CULTURE, BLOOD (ROUTINE X 2)  URINALYSIS, ROUTINE W REFLEX MICROSCOPIC  PROTIME-INR  TROPONIN I  TYPE AND SCREEN    EKG  EKG Interpretation  Date/Time:  Saturday December 22 2017 09:36:27 EST Ventricular Rate:  114 PR Interval:    QRS Duration: 98 QT Interval:  315 QTC Calculation: 434 R Axis:   -42 Text Interpretation:  Sinus tachycardia Left anterior fascicular block LVH with secondary repolarization abnormality Anterior infarct, old Baseline wander in lead(s) III V6 T wave changes no longer present compared to 2005 rate is faster Confirmed by Sherwood Gambler [phone]) on 12/04/2017 10:14:36 AM Also confirmed by Sherwood Gambler [phone]), editor Philomena Doheny [phone])  on 11/26/2017 11:45:13 AM       Radiology Dg Chest 2 View  Result Date: 12/11/2017 CLINICAL DATA:  Worsening back pain and shortness of breath. EXAM: CHEST  2 VIEW COMPARISON:  None. FINDINGS: Two-view exam shows complete whiteout of the left hemithorax with evidence of some associated left hemithoracic volume loss. Extreme right lung apex not included on the film, but visualized  portions of the right lung are clear. The visualized bony structures of the thorax are intact. IMPRESSION: Complete opacification of the left hemithorax with some associated volume loss. Central obstructing lesion with pleural effusion a distinct concern. Dedicated CT chest with IV contrast recommended to further evaluate. Electronically Signed   By: Misty Stanley M.D.   On: 12/03/2017 09:48   Ct Head Wo Contrast  Addendum Date: 12/21/2017   ADDENDUM REPORT: 12/09/2017 15:52 ADDENDUM: These results were called by telephone at the time of interpretation on 12/07/2017 at 3:52 pm to Dr. Debbe Odea , who verbally  acknowledged these results. Electronically Signed   By: Franchot Gallo M.D.   On: 12/17/2017 15:52   Result Date: 11/24/2017 CLINICAL DATA:  Non-small-cell lung cancer staging. History of melanoma. EXAM: CT HEAD WITHOUT CONTRAST TECHNIQUE: Contiguous axial images were obtained from the base of the skull through the vertex without intravenous contrast. COMPARISON:  None. FINDINGS: Brain: Hyperdense suprasellar mass lesion measures 18 x 23 x 14 mm. It is difficult determine if this extends into the sella. The sella is not enlarged. The mass is heterogeneous in density but predominantly hyperdense. No associated calcifications. There is compression of the optic chiasm. No definite invasion of the cavernous sinus. Generalized atrophy. Chronic microvascular ischemic changes in the white matter. Negative for acute ischemic infarction Vascular: Negative for hyperdense vessel. Atherosclerotic calcification. Skull: Negative Sinuses/Orbits: Mild mucosal edema left maxillary sinus. Normal orbit. Other: None IMPRESSION: Hyperdense suprasellar mass measuring 18 x 23 x 14 mm. It is difficult determine if this extends into the sella. Differential diagnosis includes pituitary macro adenoma, possibly with hemorrhage. Giant aneurysm in the differential. Meningioma, Rathke's cleft cyst, and craniopharyngioma in the  differential. Metastatic disease, possibly with hemorrhage also in the differential. Further evaluation with MRI of brain without with contrast suggested for further evaluation. Electronically Signed: By: Franchot Gallo M.D. On: 11/27/2017 14:21   Ct Angio Chest Pe W/cm &/or Wo Cm  Result Date: 12/23/2017 CLINICAL DATA:  Hemoptysis EXAM: CT ANGIOGRAPHY CHEST WITH CONTRAST TECHNIQUE: Multidetector CT imaging of the chest was performed using the standard protocol during bolus administration of intravenous contrast. Multiplanar CT image reconstructions and MIPs were obtained to evaluate the vascular anatomy. CONTRAST:  118mL ISOVUE-370 IOPAMIDOL (ISOVUE-370) INJECTION 76% COMPARISON:  CT abdomen and pelvis 01/27/2005 FINDINGS: Cardiovascular: There are no filling defects in the pulmonary arterial tree to suggest acute pulmonary thromboembolism. The main and right pulmonary artery are normal in caliber. There is severe narrowing of the left main pulmonary artery secondary to suspected malignancy. There are no opacified pulmonary arterial branches in the mid and lower left lung. Aortic atherosclerotic calcifications are noted. No evidence of aortic aneurysm or dissection. Atherosclerotic calcification and irregular soft plaque is evident in the descending thoracic aorta and upper abdominal aorta. Three vessel coronary artery calcification. Atherosclerotic calcifications at the origin of the right subclavian artery. Mediastinum/Nodes: Abnormal mediastinal adenopathy is worrisome for malignancy. 2.0 cm precarinal node. 1.9 cm right hilar node. 5.3 cm subcarinal mass is continuous with mass effect throughout the left lung. Several smaller prevascular nodes to the left of and anterior to the aortic arch. Unremarkable thyroid. The midesophagus becomes inconspicuous and inseparable from the subcarinal mass. See image 46 of series 4. Lungs/Pleura: The entire left lung is opacified and abnormal in appearance. There are no  air bronchograms. Minimal pulmonary arterial branches in the mid and lower left lung. These findings are worrisome for diffuse malignancy throughout the left lung. The left mainstem bronchus is occluded 3 cm beyond its origin. Left upper and lower lobe lobar airways are occluded. Small left pleural effusion. No pneumothorax. Scattered moderate emphysema within the right upper lobe. Upper Abdomen: Large left adrenal mass has increased in size since the prior study. There is heterogeneous enhancement and calcification. Maximal diameter is 4.0 cm. 9 mm anterior right lobe liver lesion towards the dome on image 83 is larger than on the prior study. Musculoskeletal: T7 wedge compression deformity is present. There are lytic areas within the bone worrisome for pathologic fracture. Minimal retropulsion of the mid posterior wall is  3 mm. Destructive lytic lesion in the left posterior fifth rib. Review of the MIP images confirms the above findings. IMPRESSION: No evidence of acute pulmonary thromboembolism Findings are worrisome for malignancy throughout the left lung extending into the mediastinum. There is mediastinal and right hilar adenopathy. Left pleural effusion. 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 Needle biopsy of the left fifth rib lesion should provide diagnosis with relatively low risk. Left adrenal mass has enlarged. Small liver lesion has enlarged. Aortic Atherosclerosis (ICD10-I70.0) and Emphysema (ICD10-J43.9). Electronically Signed   By: Marybelle Killings M.D.   On: 12/19/2017 10:21     Procedures .Critical Care Performed by: Sherwood Gambler, MD Authorized by: Sherwood Gambler, MD   Critical care provider statement:    Critical care time (minutes):  30   Critical care was necessary to treat or prevent imminent or life-threatening deterioration of the following conditions:  Shock and sepsis    Critical care was time spent personally by me on the following activities:  Development of treatment plan with patient or surrogate, discussions with consultants, examination of patient, evaluation of patient's response to treatment, ordering and performing treatments and interventions, ordering and review of laboratory studies, ordering and review of radiographic studies, pulse oximetry, re-evaluation of patient's condition and review of old charts   (including critical care time)  Medications Ordered in ED Medications  sodium chloride 0.9 % bolus 1,000 mL (not administered)    And  sodium chloride 0.9 % bolus 1,000 mL (not administered)    And  sodium chloride 0.9 % bolus 1,000 mL (not administered)  piperacillin-tazobactam (ZOSYN) IVPB 3.375 g (not administered)  vancomycin (VANCOCIN) IVPB 1000 mg/200 mL premix (not administered)  fentaNYL (SUBLIMAZE) injection 100 mcg (not administered)     Initial Impression / Assessment and Plan / ED Course  I have reviewed the triage vital signs and the nursing notes.  Pertinent labs & imaging results that were available during my care of the patient were reviewed by me and considered in my medical decision making (see chart for details).     Patient's lactic acid has come back at over 7.  Given his elevated WBC of 25K, this is concerning for sepsis.  However he is not febrile or hypotensive.  Given the urgency of the situation, he was treated very broadly with vancomycin and Zosyn.  Then his chest x-ray came back as a white out on the left side that is likely from obstructive mass causing pleural effusion and probable infection.  However I am also concerned about his back given that he has left quadriceps weakness in the setting of low back pain and likely cancer.  He will need MRI which has been ordered in the ED but is not back.  His vital signs are stable and thus I believe he can be admitted to the hospitalist service.  Final Clinical  Impressions(s) / ED Diagnoses   Final diagnoses:  Lactic acidosis  Pleural effusion on left    ED Discharge Orders    None       Sherwood Gambler, MD 12/21/2017 1652

## 2017-12-22 NOTE — Progress Notes (Signed)
Pharmacy Antibiotic Note  Randall Bauer is a 66 y.o. male admitted on 12/21/2017 with sepsis.  Pharmacy has been consulted for Vancomycin & Zosyn dosing.  Initial doses given in ED.  12/24/2017:  Afebrile  Leukocytosis, Elevated LA  Renal function at baseline  Plan:  Zosyn 3.375gm IV Q8h to be infused over 4hrs  Vancomycin 1gm IV q12h.  Start now for total 2gm loading dose.   Daily Scr  Monitor renal function and cx data   Vancomycin levels at steady state      Temp (24hrs), Avg:98.1 F (36.7 C), Min:97.6 F (36.4 C), Max:98.5 F (36.9 C)  Recent Labs  Lab 11/24/2017 0822 12/20/2017 0846 12/24/2017 1131  WBC 25.1*  --   --   CREATININE 0.86  --   --   LATICACIDVEN  --  7.05* 1.92*    CrCl cannot be calculated (Unknown ideal weight.).    No Known Allergies  Antimicrobials this admission: 12/29 Vanc >>  12/29 Zosyn >>   Dose adjustments this admission:  Microbiology results: 12/29 BCx:   Thank you for allowing pharmacy to be a part of this patient's care.  Biagio Borg 12/10/2017 12:18 PM

## 2017-12-22 NOTE — Progress Notes (Signed)
A consult was received from an ED physician for vancomycin and zosyn per pharmacy dosing.  The patient's profile has been reviewed for ht/wt/allergies/indication/available labs.   A one time order has been placed for vancomycin 1g and zosyn 3.375g.  Further antibiotics/pharmacy consults should be ordered by admitting physician if indicated.                       Thank you, Hershal Coria 12/20/2017  9:12 AM

## 2017-12-22 NOTE — H&P (Addendum)
History and Physical    Randall Bauer MPN:[id number] DOB: 01-17-1951 DOA: 11/30/2017    PCP: Patient, No Pcp Per  Patient coming from: home  Chief Complaint: back pain and coughing up blood  HPI: Randall Bauer is a 66 y.o. male with medical history of a squamous cell skin cancer who smokes cigarettes and has a h/o CAD with a stent placed in early 2000. He comes in for 2 reasons: coughing up blood and left lower back pain with weakness in left leg.   Hemoptysis: started yesterday and is a small amount of pure blood. No fevers and no cold or flu like symptoms. No dyspnea and no chest pain.   Lumbar pain- he fell onto his back and for 4-5 days has has trouble with left lower back pain. He is unable to stand due to pain. Feels that his left leg is weak as well. No numbness in the foot or leg and no urinary or fecal retention or incontinence issues. He had some back pain a couple of weeks ago prior to this fall and had an xray done. He was told he had some degenerative disease and sent home with pain medications and steroids. He has been taking NSAIDs for the pain as well.   Pertinent ROS: weight loss, quite rapid over the last 2 months of about 40-50 lbs with loss of appetite and generalized weakness. No other GI symptoms.   ED Course:  Given IV fluid boluses and pain medications LA 7.05 WBC 25.1 - CT chest consistent with occlusion of left mainstem with collapse of left lung, lytic lesions in bones and a left adrenal mass  Review of Systems:  All other systems reviewed and apart from HPI, are negative.  Past Medical History:  Diagnosis Date  . Melanoma ([place]) 2018    Past Surgical History:  Procedure Laterality Date  . melanoma removal      Social History:   reports that he has been smoking cigarettes.  He has been smoking about 0.50 packs per day. he has never used smokeless tobacco. He reports that he does not drink alcohol or use drugs.    No Known Allergies  No family  history on file.   Prior to Admission medications   Medication Sig Start Date End Date Taking? Authorizing Provider  ibuprofen (ADVIL,MOTRIN) 800 MG tablet Take 800 mg by mouth every 8 (eight) hours as needed for mild pain or moderate pain.   Yes [provider]  naproxen (NAPROSYN) 500 MG tablet Take 1 tablet (500 mg total) by mouth 2 (two) times daily as needed for mild pain or moderate pain. With meals 11/22/17   Suzan Slick, NP  oxyCODONE-acetaminophen (ROXICET) 5-325 MG tablet Take 1 tablet by mouth every 8 (eight) hours as needed for severe pain. 12/07/17   Suzan Slick, NP  predniSONE (DELTASONE) 20 MG tablet TK 3 TS PO TOGETHER AS ONE DOSE FOR 5 DAYS 11/16/17   [provider]    Physical Exam: Wt Readings from Last 3 Encounters:  11/18/17 91.6 kg (202 lb)   Vitals:   11/28/2017 1019 12/02/2017 1030 12/10/2017 1100 12/16/2017 1130  BP: (!) 149/99 (!) 155/84 (!) 146/84 (!) 146/89  Pulse: 99 98 97 (!) 102  Resp: (!) 21 (!) 21 (!) 25 15  Temp: 98.5 F (36.9 C)     TempSrc: Oral     SpO2: 98% 100% 99% 99%      Constitutional: NAD, calm, comfortable Eyes: PERTLA, lids and conjunctivae  normal ENMT: Mucous membranes are moist. Posterior pharynx clear of any exudate or lesions. Normal dentition.  Neck: normal, supple, no masses, no thyromegaly Respiratory: clear to auscultation bilaterally, no wheezing, no crackles. Normal respiratory effort. No accessory muscle use.  Cardiovascular: S1 & S2 heard, regular rate and rhythm, no murmurs / rubs / gallops. No extremity edema. 2+ pedal pulses. No carotid bruits.  Abdomen: No distension, no tenderness, no masses palpated. No hepatosplenomegaly. Bowel sounds normal.  Musculoskeletal: no clubbing / cyanosis. No joint deformity upper and lower extremities. Good ROM, no contractures. Normal muscle tone.  Skin: no rashes, lesions, ulcers. No induration Neurologic: CN 2-12 grossly intact. Sensation intact, DTR normal. Strength  5/5 in all 4 limbs.  Psychiatric: Normal judgment and insight. Alert and oriented x 3. Normal mood.     Labs on Admission: I have personally reviewed following labs and imaging studies  CBC: Recent Labs  Lab 12/20/2017 0822  WBC 25.1*  NEUTROABS 18.7*  HGB 12.5*  HCT 36.7*  MCV 82.7  PLT 735*   Basic Metabolic Panel: Recent Labs  Lab 12/08/2017 0822  NA 130*  K 3.0*  CL 89*  CO2 22  GLUCOSE 169*  BUN 16  CREATININE 0.86  CALCIUM 9.4   GFR: CrCl cannot be calculated (Unknown ideal weight.). Liver Function Tests: Recent Labs  Lab 11/25/2017 0822  AST 46*  ALT 34  ALKPHOS 123  BILITOT 1.3*  PROT 7.5  ALBUMIN 2.5*   No results for input(s): LIPASE, AMYLASE in the last 168 hours. No results for input(s): AMMONIA in the last 168 hours. Coagulation Profile: Recent Labs  Lab 12/03/2017 0937  INR 1.29   Cardiac Enzymes: Recent Labs  Lab 12/24/2017 0937  TROPONINI <0.03   BNP (last 3 results) No results for input(s): PROBNP in the last 8760 hours. HbA1C: No results for input(s): HGBA1C in the last 72 hours. CBG: No results for input(s): GLUCAP in the last 168 hours. Lipid Profile: No results for input(s): CHOL, HDL, LDLCALC, TRIG, CHOLHDL, LDLDIRECT in the last 72 hours. Thyroid Function Tests: No results for input(s): TSH, T4TOTAL, FREET4, T3FREE, THYROIDAB in the last 72 hours. Anemia Panel: No results for input(s): VITAMINB12, FOLATE, FERRITIN, TIBC, IRON, RETICCTPCT in the last 72 hours. Urine analysis:    Component Value Date/Time   COLORURINE BROWN BIOCHEMICALS MAY BE AFFECTED BY COLOR (A) 03/29/2010 1629   APPEARANCEUR TURBID (A) 03/29/2010 1629   LABSPEC 1.020 03/29/2010 1629   PHURINE 5.0 03/29/2010 1629   GLUCOSEU NEGATIVE 03/29/2010 1629   HGBUR LARGE (A) 03/29/2010 1629   BILIRUBINUR MODERATE (A) 03/29/2010 1629   KETONESUR 15 (A) 03/29/2010 1629   PROTEINUR 100 (A) 03/29/2010 1629   UROBILINOGEN 1.0 03/29/2010 1629   NITRITE NEGATIVE  03/29/2010 1629   LEUKOCYTESUR MODERATE (A) 03/29/2010 1629   Sepsis Labs: @LABRCNTIP (procalcitonin:4,lacticidven:4) )No results found for this or any previous visit (from the past 240 hour(s)).   Radiological Exams on Admission: Dg Chest 2 View  Result Date: 12/01/2017 CLINICAL DATA:  Worsening back pain and shortness of breath. EXAM: CHEST  2 VIEW COMPARISON:  None. FINDINGS: Two-view exam shows complete whiteout of the left hemithorax with evidence of some associated left hemithoracic volume loss. Extreme right lung apex not included on the film, but visualized portions of the right lung are clear. The visualized bony structures of the thorax are intact. IMPRESSION: Complete opacification of the left hemithorax with some associated volume loss. Central obstructing lesion with pleural effusion a distinct concern. Dedicated CT  chest with IV contrast recommended to further evaluate. Electronically Signed   By: Misty Stanley M.D.   On: 12/13/2017 09:48   Ct Angio Chest Pe W/cm &/or Wo Cm  Result Date: 12/07/2017 CLINICAL DATA:  Hemoptysis EXAM: CT ANGIOGRAPHY CHEST WITH CONTRAST TECHNIQUE: Multidetector CT imaging of the chest was performed using the standard protocol during bolus administration of intravenous contrast. Multiplanar CT image reconstructions and MIPs were obtained to evaluate the vascular anatomy. CONTRAST:  128mL ISOVUE-370 IOPAMIDOL (ISOVUE-370) INJECTION 76% COMPARISON:  CT abdomen and pelvis 01/27/2005 FINDINGS: Cardiovascular: There are no filling defects in the pulmonary arterial tree to suggest acute pulmonary thromboembolism. The main and right pulmonary artery are normal in caliber. There is severe narrowing of the left main pulmonary artery secondary to suspected malignancy. There are no opacified pulmonary arterial branches in the mid and lower left lung. Aortic atherosclerotic calcifications are noted. No evidence of aortic aneurysm or dissection. Atherosclerotic  calcification and irregular soft plaque is evident in the descending thoracic aorta and upper abdominal aorta. Three vessel coronary artery calcification. Atherosclerotic calcifications at the origin of the right subclavian artery. Mediastinum/Nodes: Abnormal mediastinal adenopathy is worrisome for malignancy. 2.0 cm precarinal node. 1.9 cm right hilar node. 5.3 cm subcarinal mass is continuous with mass effect throughout the left lung. Several smaller prevascular nodes to the left of and anterior to the aortic arch. Unremarkable thyroid. The midesophagus becomes inconspicuous and inseparable from the subcarinal mass. See image 46 of series 4. Lungs/Pleura: The entire left lung is opacified and abnormal in appearance. There are no air bronchograms. Minimal pulmonary arterial branches in the mid and lower left lung. These findings are worrisome for diffuse malignancy throughout the left lung. The left mainstem bronchus is occluded 3 cm beyond its origin. Left upper and lower lobe lobar airways are occluded. Small left pleural effusion. No pneumothorax. Scattered moderate emphysema within the right upper lobe. Upper Abdomen: Large left adrenal mass has increased in size since the prior study. There is heterogeneous enhancement and calcification. Maximal diameter is 4.0 cm. 9 mm anterior right lobe liver lesion towards the dome on image 83 is larger than on the prior study. Musculoskeletal: T7 wedge compression deformity is present. There are lytic areas within the bone worrisome for pathologic fracture. Minimal retropulsion of the mid posterior wall is 3 mm. Destructive lytic lesion in the left posterior fifth rib. Review of the MIP images confirms the above findings. IMPRESSION: No evidence of acute pulmonary thromboembolism Findings are worrisome for malignancy throughout the left lung extending into the mediastinum. There is mediastinal and right hilar adenopathy. Left pleural effusion. 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 Needle biopsy of the left fifth rib lesion should provide diagnosis with relatively low risk. Left adrenal mass has enlarged. Small liver lesion has enlarged. Aortic Atherosclerosis (ICD10-I70.0) and Emphysema (ICD10-J43.9). Electronically Signed   By: Marybelle Killings M.D.   On: 12/03/2017 10:21    Assessment/Plan Principal Problem:   Hemoptysis/   Collapse of left lung   Lytic bone lesions on xray   Adrenal mass  - CTA report reviewed- he will need a biopsy - I have consulted pulmonary - due to elevated WBC count and lactic acidosis and underlying infection is also possible and therefore antibiotics have been started and will be continued - head and abd/pelvis CTs for staging purposes  Active Problems:    Lumbar back pain -  pain is in left flank mostly and quite tender to the touch>> ? Muscular sprain vs metastasis - he has a T7 compression fracture which would not be causing pain in lumbar area - left leg weakness is likely related to the above left back pain but does not seem to be neurogical - currently he cannot lay on his back at all and has been in the left lateral position for 4 days - an MRI would be ideal but he will not be able to lay flat long enough for this- will get CT scan of his back today to further evaluate pain - this will be an abd/pelvis CT as it will help stage above cancer - pain control with Vicodin QID, Toradol QID and Dilaudid Q3hrs PRN  Lactic acidosis - cause uncertain - has improved with IVF  Leukocytosis - again cause uncertain but in setting of elevated LA, covering for infection - f/u blood cultures  Cigarette smoker - counseled   CAD (coronary artery disease), native coronary artery- with one stent - in 2005 per son   Addendum: further imaging shows L4 pathological compression fracture and mass invading left psoas which explains his back  pain CT head reveals a suprasellar mass with a possible hemorrhage- stop anticoagulation. MRI ordered but doubtful that he will able to lay flat for it until pain better controlled.  Spoke with Dr Halford Chessman again. May need biopsy next week via bronch. Will need to see if an IR guided biopsy would be a better option.    DVT prophylaxis: Lovenox Code Status: DNR  Family Communication: son and wife  Disposition Plan:   Consults called: pulmonary  Admission status: inpatient     Debbe Odea MD [facility] Pager: [email or web address] Password [email or web address] 7PM-7AM, please contact night-coverage   12/24/2017, 11:59 AM

## 2017-12-22 NOTE — ED Notes (Signed)
ED TO INPATIENT HANDOFF REPORT  Name/Age/Gender Randall Bauer 66 y.o. male  Code Status    Code Status Orders  (From admission, onward)        Start     Ordered   12/09/2017 1208  Do not attempt resuscitation (DNR)  Continuous    Question Answer Comment  In the event of cardiac or respiratory ARREST Do not call a "code blue"   In the event of cardiac or respiratory ARREST Do not perform Intubation, CPR, defibrillation or ACLS   In the event of cardiac or respiratory ARREST Use medication by any route, position, wound care, and other measures to relive pain and suffering. May use oxygen, suction and manual treatment of airway obstruction as needed for comfort.      12/23/2017 1208    Code Status History    Date Active Date Inactive Code Status Order ID Comments User Context   This patient has a current code status but no historical code status.      Home/SNF/Other Home  Chief Complaint coughing iwth blood/left leg pain  Level of Care/Admitting Diagnosis ED Disposition    ED Disposition Condition [facility] Area: [place] [[id number]]  Level of Care: Med-Surg [16]  Diagnosis: Hemoptysis [798921]  Admitting Physician: Gay[place], Moody  Attending Physician: Debbe Odea [[id number]]  Estimated length of stay: past midnight tomorrow  Certification:: I certify this patient will need inpatient services for at least 2 midnights  PT Class (Do Not Modify): Inpatient [101]  PT Acc Code (Do Not Modify): Private [1]       Medical History Past Medical History:  Diagnosis Date  . Melanoma ([place]) 2018    Allergies No Known Allergies  IV Location/Drains/Wounds Patient Lines/Drains/Airways Status   Active Line/Drains/Airways    Name:   Placement date:   Placement time:   Site:   Days:   Peripheral IV 12/01/2017 Right Antecubital   12/23/2017    0818    Antecubital   less than 1   Peripheral IV 12/13/2017 Right Wrist   12/13/2017    1013    Wrist    less than 1          Labs/Imaging Results for orders placed or performed during the hospital encounter of 12/24/2017 (from the past 48 hour(s))  Comprehensive metabolic panel     Status: Abnormal   Collection Time: 12/03/2017  8:22 AM  Result Value Ref Range   Sodium 130 (L) 135 - 145 mmol/L   Potassium 3.0 (L) 3.5 - 5.1 mmol/L   Chloride 89 (L) 101 - 111 mmol/L   CO2 22 22 - 32 mmol/L   Glucose, Bld 169 (H) 65 - 99 mg/dL   BUN 16 6 - 20 mg/dL   Creatinine, Ser 0.86 0.61 - 1.24 mg/dL   Calcium 9.4 8.9 - 10.3 mg/dL   Total Protein 7.5 6.5 - 8.1 g/dL   Albumin 2.5 (L) 3.5 - 5.0 g/dL   AST 46 (H) 15 - 41 U/L   ALT 34 17 - 63 U/L   Alkaline Phosphatase 123 38 - 126 U/L   Total Bilirubin 1.3 (H) 0.3 - 1.2 mg/dL   GFR calc non Af Amer >60 >60 mL/min   GFR calc Af Amer >60 >60 mL/min    Comment: (NOTE) The eGFR has been calculated using the CKD EPI equation. This calculation has not been validated in all clinical situations. eGFR's persistently <60 mL/min signify possible Chronic Kidney Disease.  Anion gap 19 (H) 5 - 15  CBC with Differential     Status: Abnormal   Collection Time: 11/28/2017  8:22 AM  Result Value Ref Range   WBC 25.1 (H) 4.0 - 10.5 K/uL   RBC 4.44 4.22 - 5.81 MIL/uL   Hemoglobin 12.5 (L) 13.0 - 17.0 g/dL   HCT 36.7 (L) 39.0 - 52.0 %   MCV 82.7 78.0 - 100.0 fL   MCH 28.2 26.0 - 34.0 pg   MCHC 34.1 30.0 - 36.0 g/dL   RDW 14.3 11.5 - 15.5 %   Platelets 428 (H) 150 - 400 K/uL   Neutrophils Relative % 75 %   Lymphocytes Relative 13 %   Monocytes Relative 11 %   Eosinophils Relative 1 %   Basophils Relative 0 %   Neutro Abs 18.7 (H) 1.7 - 7.7 K/uL   Lymphs Abs 3.3 0.7 - 4.0 K/uL   Monocytes Absolute 2.8 (H) 0.1 - 1.0 K/uL   Eosinophils Absolute 0.3 0.0 - 0.7 K/uL   Basophils Absolute 0.0 0.0 - 0.1 K/uL   Smear Review MORPHOLOGY UNREMARKABLE   I-Stat CG4 Lactic Acid, ED     Status: Abnormal   Collection Time: 11/28/2017  8:46 AM  Result Value Ref Range    Lactic Acid, Venous 7.05 (HH) 0.5 - 1.9 mmol/L   Comment NOTIFIED PHYSICIAN   Type and screen [place]     Status: None   Collection Time: 12/23/2017  9:37 AM  Result Value Ref Range   ABO/RH(D) O POS    Antibody Screen NEG    Sample Expiration 12/25/2017   Protime-INR     Status: Abnormal   Collection Time: 12/04/2017  9:37 AM  Result Value Ref Range   Prothrombin Time 16.0 (H) 11.4 - 15.2 seconds   INR 1.29   Troponin I     Status: None   Collection Time: 12/11/2017  9:37 AM  Result Value Ref Range   Troponin I <0.03 <0.03 ng/mL  Urinalysis, Routine w reflex microscopic     Status: Abnormal   Collection Time: 12/14/2017 11:16 AM  Result Value Ref Range   Color, Urine AMBER (A) YELLOW    Comment: BIOCHEMICALS MAY BE AFFECTED BY COLOR   APPearance CLOUDY (A) CLEAR   Specific Gravity, Urine 1.036 (H) 1.005 - 1.030   pH 6.0 5.0 - 8.0   Glucose, UA 50 (A) NEGATIVE mg/dL   Hgb urine dipstick SMALL (A) NEGATIVE   Bilirubin Urine NEGATIVE NEGATIVE   Ketones, ur 20 (A) NEGATIVE mg/dL   Protein, ur 30 (A) NEGATIVE mg/dL   Nitrite NEGATIVE NEGATIVE   Leukocytes, UA SMALL (A) NEGATIVE   RBC / HPF 0-5 0 - 5 RBC/hpf   WBC, UA 6-30 0 - 5 WBC/hpf   Bacteria, UA RARE (A) NONE SEEN   Squamous Epithelial / LPF 0-5 (A) NONE SEEN   Mucus PRESENT    Amorphous Crystal PRESENT   I-Stat CG4 Lactic Acid, ED     Status: Abnormal   Collection Time: 12/05/2017 11:31 AM  Result Value Ref Range   Lactic Acid, Venous 1.92 (H) 0.5 - 1.9 mmol/L   Dg Chest 2 View  Result Date: 12/05/2017 CLINICAL DATA:  Worsening back pain and shortness of breath. EXAM: CHEST  2 VIEW COMPARISON:  None. FINDINGS: Two-view exam shows complete whiteout of the left hemithorax with evidence of some associated left hemithoracic volume loss. Extreme right lung apex not included on the film, but visualized portions  of the right lung are clear. The visualized bony structures of the thorax are intact. IMPRESSION:  Complete opacification of the left hemithorax with some associated volume loss. Central obstructing lesion with pleural effusion a distinct concern. Dedicated CT chest with IV contrast recommended to further evaluate. Electronically Signed   By: Misty Stanley M.D.   On: 11/25/2017 09:48   Ct Head Wo Contrast  Result Date: 11/28/2017 CLINICAL DATA:  Non-small-cell lung cancer staging. History of melanoma. EXAM: CT HEAD WITHOUT CONTRAST TECHNIQUE: Contiguous axial images were obtained from the base of the skull through the vertex without intravenous contrast. COMPARISON:  None. FINDINGS: Brain: Hyperdense suprasellar mass lesion measures 18 x 23 x 14 mm. It is difficult determine if this extends into the sella. The sella is not enlarged. The mass is heterogeneous in density but predominantly hyperdense. No associated calcifications. There is compression of the optic chiasm. No definite invasion of the cavernous sinus. Generalized atrophy. Chronic microvascular ischemic changes in the white matter. Negative for acute ischemic infarction Vascular: Negative for hyperdense vessel. Atherosclerotic calcification. Skull: Negative Sinuses/Orbits: Mild mucosal edema left maxillary sinus. Normal orbit. Other: None IMPRESSION: Hyperdense suprasellar mass measuring 18 x 23 x 14 mm. It is difficult determine if this extends into the sella. Differential diagnosis includes pituitary macro adenoma, possibly with hemorrhage. Giant aneurysm in the differential. Meningioma, Rathke's cleft cyst, and craniopharyngioma in the differential. Metastatic disease, possibly with hemorrhage also in the differential. Further evaluation with MRI of brain without with contrast suggested for further evaluation. Electronically Signed   By: Franchot Gallo M.D.   On: 12/17/2017 14:21   Ct Angio Chest Pe W/cm &/or Wo Cm  Result Date: 12/15/2017 CLINICAL DATA:  Hemoptysis EXAM: CT ANGIOGRAPHY CHEST WITH CONTRAST TECHNIQUE: Multidetector CT  imaging of the chest was performed using the standard protocol during bolus administration of intravenous contrast. Multiplanar CT image reconstructions and MIPs were obtained to evaluate the vascular anatomy. CONTRAST:  149m ISOVUE-370 IOPAMIDOL (ISOVUE-370) INJECTION 76% COMPARISON:  CT abdomen and pelvis 01/27/2005 FINDINGS: Cardiovascular: There are no filling defects in the pulmonary arterial tree to suggest acute pulmonary thromboembolism. The main and right pulmonary artery are normal in caliber. There is severe narrowing of the left main pulmonary artery secondary to suspected malignancy. There are no opacified pulmonary arterial branches in the mid and lower left lung. Aortic atherosclerotic calcifications are noted. No evidence of aortic aneurysm or dissection. Atherosclerotic calcification and irregular soft plaque is evident in the descending thoracic aorta and upper abdominal aorta. Three vessel coronary artery calcification. Atherosclerotic calcifications at the origin of the right subclavian artery. Mediastinum/Nodes: Abnormal mediastinal adenopathy is worrisome for malignancy. 2.0 cm precarinal node. 1.9 cm right hilar node. 5.3 cm subcarinal mass is continuous with mass effect throughout the left lung. Several smaller prevascular nodes to the left of and anterior to the aortic arch. Unremarkable thyroid. The midesophagus becomes inconspicuous and inseparable from the subcarinal mass. See image 46 of series 4. Lungs/Pleura: The entire left lung is opacified and abnormal in appearance. There are no air bronchograms. Minimal pulmonary arterial branches in the mid and lower left lung. These findings are worrisome for diffuse malignancy throughout the left lung. The left mainstem bronchus is occluded 3 cm beyond its origin. Left upper and lower lobe lobar airways are occluded. Small left pleural effusion. No pneumothorax. Scattered moderate emphysema within the right upper lobe. Upper Abdomen: Large  left adrenal mass has increased in size since the prior study. There is heterogeneous enhancement  and calcification. Maximal diameter is 4.0 cm. 9 mm anterior right lobe liver lesion towards the dome on image 83 is larger than on the prior study. Musculoskeletal: T7 wedge compression deformity is present. There are lytic areas within the bone worrisome for pathologic fracture. Minimal retropulsion of the mid posterior wall is 3 mm. Destructive lytic lesion in the left posterior fifth rib. Review of the MIP images confirms the above findings. IMPRESSION: No evidence of acute pulmonary thromboembolism Findings are worrisome for malignancy throughout the left lung extending into the mediastinum. There is mediastinal and right hilar adenopathy. Left pleural effusion. 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 Needle biopsy of the left fifth rib lesion should provide diagnosis with relatively low risk. Left adrenal mass has enlarged. Small liver lesion has enlarged. Aortic Atherosclerosis (ICD10-I70.0) and Emphysema (ICD10-J43.9). Electronically Signed   By: Marybelle Killings M.D.   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 EXAM: CT ABDOMEN AND PELVIS WITH CONTRAST TECHNIQUE: Multidetector CT imaging of the abdomen and pelvis was performed using the standard protocol following bolus administration of intravenous contrast. CONTRAST:  36m ISOVUE-300 IOPAMIDOL (ISOVUE-300) INJECTION 61% COMPARISON:  Noncontrast CT abdomen and pelvis from 01/27/2005. FINDINGS: Lower chest: Complex left pleural effusion with abnormal enhancement in the left infrahilar region and left lung better demonstrated on recent CT chest. Hepatobiliary: 9 mm hypoattenuating lesion in the anterior dome of liver is not fully characterize. Liver  parenchyma otherwise unremarkable. No evidence for calcified gallstones. No intrahepatic or extrahepatic biliary dilation. Pancreas: 1.8 cm hypoattenuating lesion is identified in the pancreas near the junction of the body and tail. No dilatation of the main pancreatic duct. Spleen: No splenomegaly. No focal mass lesion. Adrenals/Urinary Tract: Right adrenal gland unremarkable. 4.5 cm left adrenal mass shows heterogeneous enhancement and cannot be characterized as adenoma. 8 mm low-density lesion upper pole right kidney is likely a cyst. No suspicious abnormality in the left kidney. No evidence for hydroureter. The urinary bladder appears normal for the degree of distention. Stomach/Bowel: Stomach is nondistended. No gastric wall thickening. No evidence of outlet obstruction. Duodenum is normally positioned as is the ligament of Treitz. No small bowel wall thickening. No small bowel dilatation. The terminal ileum is normal. The appendix is normal. No gross colonic mass. No colonic wall thickening. No substantial diverticular change. Vascular/Lymphatic: There is abdominal aortic atherosclerosis without aneurysm. There is no gastrohepatic or hepatoduodenal ligament lymphadenopathy. No intraperitoneal or retroperitoneal lymphadenopathy. 9 mm short axis left para-aortic lymph node is seen on image 44 of series 2. No pelvic sidewall lymphadenopathy. Reproductive: Asymmetric enlargement and enhancement identified in the left seminal vesicle. Other: No intraperitoneal free fluid. 9 mm retroperitoneal nodule identified just cranial to the right kidney. 10 mm retroperitoneal nodule identified along the left psoas muscle on image 56 series 2. Musculoskeletal: Pathologic fracture noted in the L4 vertebral body with evidence of paraspinal tumor extending into the left psoas muscle. Tumor extension into the anterior epidural space is visible on image 53 of series 2. 14 mm enhancing lesion in the left gluteal muscles on image 67  is consistent with metastatic disease. There is a similar 11 mm enhancing lesion in the right gluteus musculature on the same image. 19 mm enhancing inferior right gluteal lesion is seen on image 103. 14 mm lesion  identified posterior right abdominal wall musculature on image 50. Enhancing lesions are identified in the paraspinal musculature on images 60, 23, and 16. IMPRESSION: 1. Abnormal soft tissue in the left hemithorax with left pleural effusion better characterized on CT scan of the chest performed earlier today. 2. 4.5 cm left adrenal mass concerning for metastatic disease. 3. 1.8 cm hypoenhancing lesion in the pancreas near the junction of body and tail. Metastatic disease likely although primary pancreatic neoplasm not excluded. 4. Pathologic fracture at L4 with extra-spinal tumor extension into the left psoas muscle and anterior epidural space of the spinal canal. 5. Scattered metastatic deposits in the paraspinal muscles, right abdominal wall musculature and bilateral gluteal musculature. 6. Asymmetric enlargement and enhancement of the left seminal vesicle concerning for metastatic involvement. Electronically Signed   By: Misty Stanley M.D.   On: 11/28/2017 14:21    Pending Labs Unresulted Labs (From admission, onward)   Start     Ordered   12/29/17 0500  Creatinine, serum  (enoxaparin (LOVENOX)    CrCl >/= 30 ml/min)  Weekly,   R    Comments:  while on enoxaparin therapy    11/24/2017 1208   12/23/17 4627  Basic metabolic panel  Tomorrow morning,   R     12/20/2017 1208   12/23/17 0500  Comprehensive metabolic panel  Tomorrow morning,   R     12/23/2017 1208   12/23/17 0500  Creatinine, serum  Daily,   R     11/28/2017 1240   12/23/17 0500  Procalcitonin  Daily,   R     12/20/2017 1302   12/05/2017 1303  Procalcitonin - Baseline  STAT,   STAT     12/06/2017 1302   11/24/2017 1303  MRSA PCR Screening  Once,   R     12/13/2017 1302   11/30/2017 1207  HIV antibody (Routine Testing)  Once,   R      12/02/2017 1208   12/23/2017 1207  CBC  (enoxaparin (LOVENOX)    CrCl >/= 30 ml/min)  Once,   R    Comments:  Baseline for enoxaparin therapy IF NOT ALREADY DRAWN.  Notify MD if PLT < 100 K.    12/15/2017 1208   12/06/2017 1207  Creatinine, serum  (enoxaparin (LOVENOX)    CrCl >/= 30 ml/min)  Once,   R    Comments:  Baseline for enoxaparin therapy IF NOT ALREADY DRAWN.    12/12/2017 1208   12/20/2017 0937  ABO/Rh  Once,   R     12/17/2017 0937   12/12/2017 0902  Blood Culture (routine x 2)  BLOOD CULTURE X 2,   STAT     12/12/2017 0902      Vitals/Pain Today's Vitals   12/03/2017 1238 11/26/2017 1254 12/05/2017 1300 12/17/2017 1330  BP:   (!) 146/79 140/88  Pulse:   91 91  Resp:   18 (!) 21  Temp:      TempSrc:      SpO2:   97%   PainSc: 2  2       Isolation Precautions No active isolations  Medications Medications  HYDROmorphone (DILAUDID) injection 1 mg (1 mg Intravenous Given 12/08/2017 1226)  HYDROcodone-acetaminophen (NORCO) 7.5-325 MG per tablet 1 tablet (1 tablet Oral Given 12/05/2017 1237)  ketorolac (TORADOL) 30 MG/ML injection 30 mg (30 mg Intravenous Given 12/21/2017 1227)  enoxaparin (LOVENOX) injection 40 mg (not administered)  0.9 %  sodium chloride infusion ( Intravenous New Bag/Given 11/27/2017 1237)  acetaminophen (TYLENOL) tablet 650 mg (not administered)    Or  acetaminophen (TYLENOL) suppository 650 mg (not administered)  ondansetron (ZOFRAN) tablet 4 mg ( Oral See Alternative 12/19/2017 1229)    Or  ondansetron (ZOFRAN) injection 4 mg (4 mg Intravenous Given 11/25/2017 1229)  piperacillin-tazobactam (ZOSYN) IVPB 3.375 g (not administered)  vancomycin (VANCOCIN) IVPB 1000 mg/200 mL premix (not administered)  sodium chloride 0.9 % bolus 1,000 mL (0 mLs Intravenous Stopped 12/16/2017 1050)    And  sodium chloride 0.9 % bolus 1,000 mL (0 mLs Intravenous Stopped 12/13/2017 1051)    And  sodium chloride 0.9 % bolus 1,000 mL (0 mLs Intravenous Stopped 12/21/2017 1214)  piperacillin-tazobactam  (ZOSYN) IVPB 3.375 g (0 g Intravenous Stopped 11/27/2017 1050)  vancomycin (VANCOCIN) IVPB 1000 mg/200 mL premix (0 mg Intravenous Stopped 12/04/2017 1102)  fentaNYL (SUBLIMAZE) injection 100 mcg (100 mcg Intravenous Given 12/02/2017 1010)  sodium chloride 0.9 % injection (  Given 12/15/2017 0945)  iopamidol (ISOVUE-370) 76 % injection 100 mL (100 mLs Intravenous Contrast Given 12/12/2017 0947)  potassium chloride SA (K-DUR,KLOR-CON) CR tablet 40 mEq (40 mEq Oral Given 12/04/2017 1103)  iopamidol (ISOVUE-300) 61 % injection 100 mL (80 mLs Intravenous Contrast Given 12/19/2017 1352)    Mobility walks

## 2017-12-22 NOTE — Consult Note (Signed)
PULMONARY / CRITICAL CARE MEDICINE   Name: Randall Bauer MRN: [id number] DOB: Jun 19, 1951    ADMISSION DATE:  11/24/2017 CONSULTATION DATE:  12/01/2017 REFERRING MD:  Dr. Wynelle Cleveland, [facility]  CHIEF COMPLAINT:  Back pain  HISTORY OF PRESENT ILLNESS:   66 yr old male smoker presented to the ER with back pain.  He also had cough with blood tinged sputum.  He had a fall two weeks prior to admission.  He felt weak while walking up stairs, and then he knees buckled.  He has lost about 50 lbs over past 2 months.  He has been hoarse for the past two months.  He had chest xray that showed opacification of Lt hemithorax.  He had CT chest that showed narrowing of Lt PA, atherosclerosis, 2 cm precarinal node, 1.9 cm Rt hilar node, 5.3 cm subcarinal mass continuous with Lt lung, opacification of Lt lung, occlusion of Lt mainstem bronchus, small Lt effusion, emphysema, Lt adrenal mass, lytic bone lesions in spine and Lt fifth rib.  CT head, abdomen and pelvis detailed below.  He can't lay on his back due to pain.  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  PAST SURGICAL HISTORY: He  has a past surgical history that includes melanoma removal.  No Known Allergies  No current facility-administered medications on file prior to encounter.    Current Outpatient Medications on File Prior to Encounter  Medication Sig  . ibuprofen (ADVIL,MOTRIN) 800 MG tablet Take 800 mg by mouth every 8 (eight) hours as needed for mild pain or moderate pain.  . naproxen (NAPROSYN) 500 MG tablet Take 1 tablet (500 mg total) by mouth 2 (two) times daily as needed for mild pain or moderate pain. With meals  . oxyCODONE-acetaminophen (ROXICET) 5-325 MG tablet Take 1 tablet by mouth every 8 (eight) hours as needed for severe pain.  . predniSONE (DELTASONE) 20 MG tablet TK 3 TS PO TOGETHER AS ONE DOSE FOR 5 DAYS    FAMILY HISTORY:  His has no family status  information on file.    SOCIAL HISTORY: He  reports that he has been smoking cigarettes.  He has been smoking about 0.50 packs per day. he has never used smokeless tobacco. He reports that he does not drink alcohol or use drugs.  REVIEW OF SYSTEMS:   12 point ROS negative except above.  SUBJECTIVE:   VITAL SIGNS: BP 124/65 (BP Location: Right Arm)   Pulse 95   Temp 98.6 F (37 C) (Oral)   Resp 18   SpO2 97%   INTAKE / OUTPUT: No intake/output data recorded.  PHYSICAL EXAMINATION:  General - pleasant Eyes - pupils reactive, wearing  ENT - no sinus tenderness, no oral exudate, no LAN, weak/hoarse voice Cardiac - regular, no murmur Chest - no breath sounds on Lt Abd - soft, non tender Ext - no edema Skin - no rashes Neuro - normal strength Psych - normal mood  LABS:  BMET Recent Labs  Lab 12/13/2017 0822 12/24/2017 1519  NA 130*  --   K 3.0*  --   CL 89*  --   CO2 22  --   BUN 16  --   CREATININE 0.86 0.64  GLUCOSE 169*  --     Electrolytes Recent Labs  Lab 11/27/2017 0822  CALCIUM 9.4    CBC Recent Labs  Lab 12/06/2017 0822  WBC 25.1*  HGB 12.5*  HCT 36.7*  PLT 428*    Coag's Recent Labs  Lab 12/13/2017 0937  INR 1.29    Sepsis Markers Recent Labs  Lab 12/23/2017 0846 11/25/2017 1131  LATICACIDVEN 7.05* 1.92*    ABG No results for input(s): PHART, PCO2ART, PO2ART in the last 168 hours.  Liver Enzymes Recent Labs  Lab 12/09/2017 0822  AST 46*  ALT 34  ALKPHOS 123  BILITOT 1.3*  ALBUMIN 2.5*    Cardiac Enzymes Recent Labs  Lab 11/24/2017 0937  TROPONINI <0.03    Glucose No results for input(s): GLUCAP in the last 168 hours.  Imaging Dg Chest 2 View  Result Date: 12/16/2017 CLINICAL DATA:  Worsening back pain and shortness of breath. EXAM: CHEST  2 VIEW COMPARISON:  None. FINDINGS: Two-view exam shows complete whiteout of the left hemithorax with evidence of some associated left hemithoracic volume loss. Extreme right lung apex  not included on the film, but visualized portions of the right lung are clear. The visualized bony structures of the thorax are intact. IMPRESSION: Complete opacification of the left hemithorax with some associated volume loss. Central obstructing lesion with pleural effusion a distinct concern. Dedicated CT chest with IV contrast recommended to further evaluate. Electronically Signed   By: Misty Stanley M.D.   On: 12/19/2017 09:48   Ct Head Wo Contrast  Result Date: 11/26/2017 CLINICAL DATA:  Non-small-cell lung cancer staging. History of melanoma. EXAM: CT HEAD WITHOUT CONTRAST TECHNIQUE: Contiguous axial images were obtained from the base of the skull through the vertex without intravenous contrast. COMPARISON:  None. FINDINGS: Brain: Hyperdense suprasellar mass lesion measures 18 x 23 x 14 mm. It is difficult determine if this extends into the sella. The sella is not enlarged. The mass is heterogeneous in density but predominantly hyperdense. No associated calcifications. There is compression of the optic chiasm. No definite invasion of the cavernous sinus. Generalized atrophy. Chronic microvascular ischemic changes in the white matter. Negative for acute ischemic infarction Vascular: Negative for hyperdense vessel. Atherosclerotic calcification. Skull: Negative Sinuses/Orbits: Mild mucosal edema left maxillary sinus. Normal orbit. Other: None IMPRESSION: Hyperdense suprasellar mass measuring 18 x 23 x 14 mm. It is difficult determine if this extends into the sella. Differential diagnosis includes pituitary macro adenoma, possibly with hemorrhage. Giant aneurysm in the differential. Meningioma, Rathke's cleft cyst, and craniopharyngioma in the differential. Metastatic disease, possibly with hemorrhage also in the differential. Further evaluation with MRI of brain without with contrast suggested for further evaluation. Electronically Signed   By: Franchot Gallo M.D.   On: 12/17/2017 14:21   Ct Angio Chest  Pe W/cm &/or Wo Cm  Result Date: 12/24/2017 CLINICAL DATA:  Hemoptysis EXAM: CT ANGIOGRAPHY CHEST WITH CONTRAST TECHNIQUE: Multidetector CT imaging of the chest was performed using the standard protocol during bolus administration of intravenous contrast. Multiplanar CT image reconstructions and MIPs were obtained to evaluate the vascular anatomy. CONTRAST:  162mL ISOVUE-370 IOPAMIDOL (ISOVUE-370) INJECTION 76% COMPARISON:  CT abdomen and pelvis 01/27/2005 FINDINGS: Cardiovascular: There are no filling defects in the pulmonary arterial tree to suggest acute pulmonary thromboembolism. The main and right pulmonary artery are normal in caliber. There is severe narrowing of the left main pulmonary artery secondary to suspected malignancy. There are no opacified pulmonary arterial branches in the mid and lower left lung. Aortic atherosclerotic calcifications are noted. No evidence of aortic aneurysm or dissection. Atherosclerotic calcification and irregular soft plaque is evident in the descending thoracic aorta and upper abdominal aorta. Three vessel coronary artery calcification. Atherosclerotic  calcifications at the origin of the right subclavian artery. Mediastinum/Nodes: Abnormal mediastinal adenopathy is worrisome for malignancy. 2.0 cm precarinal node. 1.9 cm right hilar node. 5.3 cm subcarinal mass is continuous with mass effect throughout the left lung. Several smaller prevascular nodes to the left of and anterior to the aortic arch. Unremarkable thyroid. The midesophagus becomes inconspicuous and inseparable from the subcarinal mass. See image 46 of series 4. Lungs/Pleura: The entire left lung is opacified and abnormal in appearance. There are no air bronchograms. Minimal pulmonary arterial branches in the mid and lower left lung. These findings are worrisome for diffuse malignancy throughout the left lung. The left mainstem bronchus is occluded 3 cm beyond its origin. Left upper and lower lobe lobar airways  are occluded. Small left pleural effusion. No pneumothorax. Scattered moderate emphysema within the right upper lobe. Upper Abdomen: Large left adrenal mass has increased in size since the prior study. There is heterogeneous enhancement and calcification. Maximal diameter is 4.0 cm. 9 mm anterior right lobe liver lesion towards the dome on image 83 is larger than on the prior study. Musculoskeletal: T7 wedge compression deformity is present. There are lytic areas within the bone worrisome for pathologic fracture. Minimal retropulsion of the mid posterior wall is 3 mm. Destructive lytic lesion in the left posterior fifth rib. Review of the MIP images confirms the above findings. IMPRESSION: No evidence of acute pulmonary thromboembolism Findings are worrisome for malignancy throughout the left lung extending into the mediastinum. There is mediastinal and right hilar adenopathy. Left pleural effusion. 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 Needle biopsy of the left fifth rib lesion should provide diagnosis with relatively low risk. Left adrenal mass has enlarged. Small liver lesion has enlarged. Aortic Atherosclerosis (ICD10-I70.0) and Emphysema (ICD10-J43.9). Electronically Signed   By: Marybelle Killings M.D.   On: 12/15/2017 10:21   Ct Abdomen Pelvis W Contrast  Result Date: 12/06/2017 CLINICAL DATA:  Squamous cell skin cancer with left flank and back pain. EXAM: CT ABDOMEN AND PELVIS WITH CONTRAST TECHNIQUE: Multidetector CT imaging of the abdomen and pelvis was performed using the standard protocol following bolus administration of intravenous contrast. CONTRAST:  2mL ISOVUE-300 IOPAMIDOL (ISOVUE-300) INJECTION 61% COMPARISON:  Noncontrast CT abdomen and pelvis from 01/27/2005. FINDINGS: Lower chest: Complex left pleural effusion with abnormal enhancement in the left infrahilar region and left lung better  demonstrated on recent CT chest. Hepatobiliary: 9 mm hypoattenuating lesion in the anterior dome of liver is not fully characterize. Liver parenchyma otherwise unremarkable. No evidence for calcified gallstones. No intrahepatic or extrahepatic biliary dilation. Pancreas: 1.8 cm hypoattenuating lesion is identified in the pancreas near the junction of the body and tail. No dilatation of the main pancreatic duct. Spleen: No splenomegaly. No focal mass lesion. Adrenals/Urinary Tract: Right adrenal gland unremarkable. 4.5 cm left adrenal mass shows heterogeneous enhancement and cannot be characterized as adenoma. 8 mm low-density lesion upper pole right kidney is likely a cyst. No suspicious abnormality in the left kidney. No evidence for hydroureter. The urinary bladder appears normal for the degree of distention. Stomach/Bowel: Stomach is nondistended. No gastric wall thickening. No evidence of outlet obstruction. Duodenum is normally positioned as is the ligament of Treitz. No small bowel wall thickening. No small bowel dilatation. The terminal ileum is normal. The appendix is normal. No gross colonic mass. No colonic wall thickening. No substantial diverticular change. Vascular/Lymphatic: There is abdominal aortic atherosclerosis  without aneurysm. There is no gastrohepatic or hepatoduodenal ligament lymphadenopathy. No intraperitoneal or retroperitoneal lymphadenopathy. 9 mm short axis left para-aortic lymph node is seen on image 44 of series 2. No pelvic sidewall lymphadenopathy. Reproductive: Asymmetric enlargement and enhancement identified in the left seminal vesicle. Other: No intraperitoneal free fluid. 9 mm retroperitoneal nodule identified just cranial to the right kidney. 10 mm retroperitoneal nodule identified along the left psoas muscle on image 56 series 2. Musculoskeletal: Pathologic fracture noted in the L4 vertebral body with evidence of paraspinal tumor extending into the left psoas muscle. Tumor  extension into the anterior epidural space is visible on image 53 of series 2. 14 mm enhancing lesion in the left gluteal muscles on image 67 is consistent with metastatic disease. There is a similar 11 mm enhancing lesion in the right gluteus musculature on the same image. 19 mm enhancing inferior right gluteal lesion is seen on image 103. 14 mm lesion identified posterior right abdominal wall musculature on image 50. Enhancing lesions are identified in the paraspinal musculature on images 60, 23, and 16. IMPRESSION: 1. Abnormal soft tissue in the left hemithorax with left pleural effusion better characterized on CT scan of the chest performed earlier today. 2. 4.5 cm left adrenal mass concerning for metastatic disease. 3. 1.8 cm hypoenhancing lesion in the pancreas near the junction of body and tail. Metastatic disease likely although primary pancreatic neoplasm not excluded. 4. Pathologic fracture at L4 with extra-spinal tumor extension into the left psoas muscle and anterior epidural space of the spinal canal. 5. Scattered metastatic deposits in the paraspinal muscles, right abdominal wall musculature and bilateral gluteal musculature. 6. Asymmetric enlargement and enhancement of the left seminal vesicle concerning for metastatic involvement. Electronically Signed   By: Misty Stanley M.D.   On: 12/05/2017 14:21     STUDIES:  CT angio chest 12/29 >> Lt PA, atherosclerosis, 2 cm precarinal node, 1.9 cm Rt hilar node, 5.3 cm subcarinal mass continuous with Lt lung, opacification of Lt lung, occlusion of Lt mainstem bronchus, small Lt effusion, emphysema, Lt adrenal mass, lytic bone lesions in spine and Lt fifth rib. CT head 12/29 >> 2.3 cm suprasellar mass CT abd/pelvis 12/29 >> 4.5 cm lt adrenal mass, 1.8 cm lesion in body/tail of pancreas, pathologic fx L4, scattered metastatic deposits paraspinal muscles, Rt abdominal wall, b/l gluteal muscles, enlargement of Lt seminal vesicle  CULTURES: Blood 12/29  >>  ANTIBIOTICS: Vancomycin 12/29 >> Zosyn 12/29 >>   SIGNIFICANT EVENTS: 12/29 Admit  DISCUSSION: 66 yo male with metastatic cancer likely from lung primary, but has unusual distribution of metastatic lesions to suprasellar region, musculature and Lt seminal vesicle.    ASSESSMENT / PLAN:  Metastatic cancer. - will likely need to arrange for bronchoscopy next week if no other easier biopsy location is available - discussed procedure with patient and his family  Post-obstructive pneumonia. - continue Abx  Hoarseness. 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 - per primary team   Suprasellar mass. - f/u MRI brain  Chesley Mires, MD [place] 12/12/2017, 4:13 PM Pager:  [phone] After 7pm call: [phone]

## 2017-12-23 ENCOUNTER — Encounter (HOSPITAL_COMMUNITY): Payer: Self-pay

## 2017-12-23 DIAGNOSIS — R918 Other nonspecific abnormal finding of lung field: Secondary | ICD-10-CM

## 2017-12-23 DIAGNOSIS — C7931 Secondary malignant neoplasm of brain: Secondary | ICD-10-CM

## 2017-12-23 DIAGNOSIS — R1909 Other intra-abdominal and pelvic swelling, mass and lump: Secondary | ICD-10-CM

## 2017-12-23 DIAGNOSIS — J181 Lobar pneumonia, unspecified organism: Secondary | ICD-10-CM

## 2017-12-23 LAB — COMPREHENSIVE METABOLIC PANEL
ALBUMIN: 1.9 g/dL — AB (ref 3.5–5.0)
ALT: 26 U/L (ref 17–63)
AST: 26 U/L (ref 15–41)
Alkaline Phosphatase: 91 U/L (ref 38–126)
Anion gap: 8 (ref 5–15)
BUN: 17 mg/dL (ref 6–20)
CHLORIDE: 100 mmol/L — AB (ref 101–111)
CO2: 25 mmol/L (ref 22–32)
CREATININE: 0.68 mg/dL (ref 0.61–1.24)
Calcium: 8.6 mg/dL — ABNORMAL LOW (ref 8.9–10.3)
GFR calc non Af Amer: >60 mL/min (ref >60)
Glucose, Bld: 164 mg/dL — ABNORMAL HIGH (ref 65–99)
Potassium: 3.8 mmol/L (ref 3.5–5.1)
SODIUM: 133 mmol/L — AB (ref 135–145)
Total Bilirubin: 0.7 mg/dL (ref 0.3–1.2)
Total Protein: 5.6 g/dL — ABNORMAL LOW (ref 6.5–8.1)

## 2017-12-23 LAB — PROCALCITONIN: Procalcitonin: 0.66 ng/mL

## 2017-12-23 LAB — HIV ANTIBODY (ROUTINE TESTING W REFLEX): HIV SCREEN 4TH GENERATION: NONREACTIVE

## 2017-12-23 MED ORDER — DEXTROSE 5 % IV SOLN
1.0000 g | INTRAVENOUS | Status: DC
  Administered 2017-12-23 – 2017-12-24 (×2): 1 g via INTRAVENOUS
  Filled 2017-12-23 (×2): qty 10

## 2017-12-23 NOTE — Progress Notes (Addendum)
PROGRESS NOTE    Randall Bauer  CBJ:[id number] DOB: 1951-06-07 DOA: 12/21/2017 PCP: Patient, No Pcp Per      Brief Narrative:  Mr. Randall Bauer is a 66 yo M with past medical history significant for smoking, SCC of the face and CAD s/p STEMI/PCI in 2005 as well as active smoking who presents with limp and hemoptysis.    CT chest showed lung mass and post-obstructive pneumonia.  Follow up CT head and Abd/Pel shows brain met, large adrenal met, and numerous lytic bone lesions, nodes, and small masses in the  paraspinal muscles.   Assessment & Plan:  Principal Problem:   Hemoptysis Active Problems:   CAD (coronary artery disease), native coronary artery- with one stent   Lumbar back pain   Collapse of left lung   Lytic bone lesions on xray   Adrenal mass (HCC)     New lung mass Lytic bone lesions Adrenal mass Paraspinal masses Brain mass Interestingly, he had a 3.5cm adrenal mass in Jan of lats year, saw a Randall Bauer, got an adrenal MRI in follow up, had benign features, did not require follow up. -Consult IR for possible biopsy -Consult Pulm; if no perc biopsy possible, will obtain bronchoscopy tomorrow -Oxycodone, Toradol or Dilaudid PRN for pain -He has no PCP, will need close follow up with Onc -Continue dexamethasone for brain mass    Lumbar compression fracture Pathological from lytic lesion -IR consult -Pain control  Post-obstructive pneumonia Lactate initially >7, procalcitonin elevated.  No risk factors for drug resistant infection. -Narrow to Ceftriaxone  Coronary disease -Hold aspirin given hemorrhagic brain lesion  Hyponatremia Mild, in setting of lung mass.  Dehydration.  Hypokalemia Resolved  Anemia Mild, in setting of likely cancer.  No reports of bleeding.    DVT prophylaxis: SCDs Code Status: DNR Family Communication: None present Disposition Plan: COnsult to IR.  Either perc biopsy or bronchoscopy tomorrow.  Treat pneumonia and to home or  SNF in 2-3 days, if all goes well   Consultants:   Pulm  IR  Procedures:   CT C/A/P  CT head  Antimicrobials:   Vancomycin 12/29 >> 12/30  Zosyn 12/29 >> 12/30  Ceftriaxone 12/30 >>    Subjective: Feels okay.  Voice hoarse.  No new fever.  No new confusion.  HR imporved.  Pain well controlled overnight and got good sleep.    Objective: Vitals:   12/05/2017 1430 11/29/2017 1500 12/16/2017 2150 12/23/17 0540  BP: (!) 135/91 124/65 123/72 116/75  Pulse: 95 95 84 81  Resp: (!) _0 Temp:  98.6 F (37 C) 98.4 F (36.9 C) 98.3 F (36.8 C)  TempSrc:  Oral Oral Oral  SpO2: 95% 97% 97% 98%  Height:  _1  (1.778 m)      Intake/Output Summary (Last 24 hours) at 12/23/2017 1607 Last data filed at 12/23/2017 3710 Gross per 24 hour  Intake 4390 ml  Output 2375 ml  Net 2015 ml   Filed Weights    Examination: General appearance: Older adult male, alert and in no acute distress.  Lying on side.   HEENT: Anicteric, conjunctiva pink, lids and lashes normal. No nasal deformity, discharge, epistaxis.   Lymph: No supraclavicular or cervical lymphadenopathy.  ?right axilary nodule, rubbery, mobile, small. Skin: Warm and dry.  No jaundice.  No suspicious rashes or lesions. Cardiac: RRR, nl S1-S2, no murmurs appreciated.  Capillary refill is brisk. No LE edema. Respiratory: Normal respiratory rate and rhythm.  No wheezing, diminishe don  left. Abdomen: Abdomen soft.  No TTP. No ascites, distension, hepatosplenomegaly.   MSK: No deformities or effusions.  Tenderness in lower back bilaterally. Neuro: Awake and alert.  EOMI, moves all extremities. Speech fluent.    Psych: Sensorium intact and responding to questions, attention normal. Affect normal.  Judgment and insight appear normal.    Data Reviewed: I have personally reviewed following labs and imaging studies:  CBC: Recent Labs  Lab 11/27/2017 0822 11/28/2017 1519  WBC 25.1* 12.8*  NEUTROABS 18.7*  --   HGB 12.5*  10.1*  HCT 36.7* 29.8*  MCV 82.7 83.0  PLT 428* 831   Basic Metabolic Panel: Recent Labs  Lab 12/12/2017 0822 12/13/2017 1519 12/23/17 0502  NA 130*  --  133*  K 3.0*  --  3.8  CL 89*  --  100*  CO2 22  --  25  GLUCOSE 169*  --  164*  BUN 16  --  17  CREATININE 0.86 0.64 0.68  CALCIUM 9.4  --  8.6*   GFR: CrCl cannot be calculated (Unknown ideal weight.). Liver Function Tests: Recent Labs  Lab 12/20/2017 0822 12/23/17 0502  AST 46* 26  ALT 34 26  ALKPHOS 123 91  BILITOT 1.3* 0.7  PROT 7.5 5.6*  ALBUMIN 2.5* 1.9*   No results for input(s): LIPASE, AMYLASE in the last 168 hours. No results for input(s): AMMONIA in the last 168 hours. Coagulation Profile: Recent Labs  Lab 12/13/2017 0937  INR 1.29   Cardiac Enzymes: Recent Labs  Lab 11/26/2017 0937  TROPONINI <0.03   BNP (last 3 results) No results for input(s): PROBNP in the last 8760 hours. HbA1C: No results for input(s): HGBA1C in the last 72 hours. CBG: No results for input(s): GLUCAP in the last 168 hours. Lipid Profile: No results for input(s): CHOL, HDL, LDLCALC, TRIG, CHOLHDL, LDLDIRECT in the last 72 hours. Thyroid Function Tests: No results for input(s): TSH, T4TOTAL, FREET4, T3FREE, THYROIDAB in the last 72 hours. Anemia Panel: No results for input(s): VITAMINB12, FOLATE, FERRITIN, TIBC, IRON, RETICCTPCT in the last 72 hours. Urine analysis:    Component Value Date/Time   COLORURINE AMBER (A) 12/15/2017 1116   APPEARANCEUR CLOUDY (A) 12/18/2017 1116   LABSPEC 1.036 (H) 12/19/2017 1116   PHURINE 6.0 12/20/2017 1116   GLUCOSEU 50 (A) 12/05/2017 1116   HGBUR SMALL (A) 11/28/2017 1116   BILIRUBINUR NEGATIVE 12/06/2017 1116   KETONESUR 20 (A) 11/24/2017 1116   PROTEINUR 30 (A) 12/07/2017 1116   UROBILINOGEN 1.0 03/29/2010 1629   NITRITE NEGATIVE 12/09/2017 1116   LEUKOCYTESUR SMALL (A) 12/04/2017 1116   Sepsis Labs: _0 (procalcitonin:4,lacticacidven:4)  ) Recent Results (from the past  240 hour(s))  Blood Culture (routine x 2)     Status: None (Preliminary result)   Collection Time: 11/30/2017  9:37 AM  Result Value Ref Range Status   Specimen Description   Final    BLOOD LEFT ANTECUBITAL Performed at [facility], [place] [address] [place], [place] [address]    Special Requests   Final    BOTTLES DRAWN AEROBIC AND ANAEROBIC Blood Culture adequate volume   Culture PENDING  Incomplete   Report Status PENDING  Incomplete  Blood Culture (routine x 2)     Status: None (Preliminary result)   Collection Time: 11/24/2017  9:43 AM  Result Value Ref Range Status   Specimen Description BLOOD LEFT ANTECUBITAL  Final   Special Requests   Final    BOTTLES DRAWN AEROBIC AND ANAEROBIC Blood Culture  adequate volume   Culture PENDING  Incomplete   Report Status PENDING  Incomplete         Radiology Studies: Dg Chest 2 View  Result Date: 12/05/2017 CLINICAL DATA:  Worsening back pain and shortness of breath. EXAM: CHEST  2 VIEW COMPARISON:  None. FINDINGS: Two-view exam shows complete whiteout of the left hemithorax with evidence of some associated left hemithoracic volume loss. Extreme right lung apex not included on the film, but visualized portions of the right lung are clear. The visualized bony structures of the thorax are intact. IMPRESSION: Complete opacification of the left hemithorax with some associated volume loss. Central obstructing lesion with pleural effusion a distinct concern. Dedicated CT chest with IV contrast recommended to further evaluate. Electronically Signed   By: Misty Stanley M.D.   On: 12/20/2017 09:48   Ct Head Wo Contrast  Addendum Date: 12/03/2017   ADDENDUM REPORT: 12/05/2017 15:52 ADDENDUM: These results were called by telephone at the time of interpretation on 12/16/2017 at 3:52 pm to Dr. Debbe Odea , who verbally acknowledged these results. Electronically Signed   By: Franchot Gallo M.D.   On: 12/11/2017 15:52   Result Date:  11/28/2017 CLINICAL DATA:  Non-small-cell lung cancer staging. History of melanoma. EXAM: CT HEAD WITHOUT CONTRAST TECHNIQUE: Contiguous axial images were obtained from the base of the skull through the vertex without intravenous contrast. COMPARISON:  None. FINDINGS: Brain: Hyperdense suprasellar mass lesion measures 18 x 23 x 14 mm. It is difficult determine if this extends into the sella. The sella is not enlarged. The mass is heterogeneous in density but predominantly hyperdense. No associated calcifications. There is compression of the optic chiasm. No definite invasion of the cavernous sinus. Generalized atrophy. Chronic microvascular ischemic changes in the white matter. Negative for acute ischemic infarction Vascular: Negative for hyperdense vessel. Atherosclerotic calcification. Skull: Negative Sinuses/Orbits: Mild mucosal edema left maxillary sinus. Normal orbit. Other: None IMPRESSION: Hyperdense suprasellar mass measuring 18 x 23 x 14 mm. It is difficult determine if this extends into the sella. Differential diagnosis includes pituitary macro adenoma, possibly with hemorrhage. Giant aneurysm in the differential. Meningioma, Rathke's cleft cyst, and craniopharyngioma in the differential. Metastatic disease, possibly with hemorrhage also in the differential. Further evaluation with MRI of brain without with contrast suggested for further evaluation. Electronically Signed: By: Franchot Gallo M.D. On: 12/16/2017 14:21   Ct Angio Chest Pe W/cm &/or Wo Cm  Result Date: 12/15/2017 CLINICAL DATA:  Hemoptysis EXAM: CT ANGIOGRAPHY CHEST WITH CONTRAST TECHNIQUE: Multidetector CT imaging of the chest was performed using the standard protocol during bolus administration of intravenous contrast. Multiplanar CT image reconstructions and MIPs were obtained to evaluate the vascular anatomy. CONTRAST:  134m ISOVUE-370 IOPAMIDOL (ISOVUE-370) INJECTION 76% COMPARISON:  CT abdomen and pelvis 01/27/2005 FINDINGS:  Cardiovascular: There are no filling defects in the pulmonary arterial tree to suggest acute pulmonary thromboembolism. The main and right pulmonary artery are normal in caliber. There is severe narrowing of the left main pulmonary artery secondary to suspected malignancy. There are no opacified pulmonary arterial branches in the mid and lower left lung. Aortic atherosclerotic calcifications are noted. No evidence of aortic aneurysm or dissection. Atherosclerotic calcification and irregular soft plaque is evident in the descending thoracic aorta and upper abdominal aorta. Three vessel coronary artery calcification. Atherosclerotic calcifications at the origin of the right subclavian artery. Mediastinum/Nodes: Abnormal mediastinal adenopathy is worrisome for malignancy. 2.0 cm precarinal node. 1.9 cm right hilar node. 5.3 cm subcarinal mass is continuous  with mass effect throughout the left lung. Several smaller prevascular nodes to the left of and anterior to the aortic arch. Unremarkable thyroid. The midesophagus becomes inconspicuous and inseparable from the subcarinal mass. See image 46 of series 4. Lungs/Pleura: The entire left lung is opacified and abnormal in appearance. There are no air bronchograms. Minimal pulmonary arterial branches in the mid and lower left lung. These findings are worrisome for diffuse malignancy throughout the left lung. The left mainstem bronchus is occluded 3 cm beyond its origin. Left upper and lower lobe lobar airways are occluded. Small left pleural effusion. No pneumothorax. Scattered moderate emphysema within the right upper lobe. Upper Abdomen: Large left adrenal mass has increased in size since the prior study. There is heterogeneous enhancement and calcification. Maximal diameter is 4.0 cm. 9 mm anterior right lobe liver lesion towards the dome on image 83 is larger than on the prior study. Musculoskeletal: T7 wedge compression deformity is present. There are lytic areas  within the bone worrisome for pathologic fracture. Minimal retropulsion of the mid posterior wall is 3 mm. Destructive lytic lesion in the left posterior fifth rib. Review of the MIP images confirms the above findings. IMPRESSION: No evidence of acute pulmonary thromboembolism Findings are worrisome for malignancy throughout the left lung extending into the mediastinum. There is mediastinal and right hilar adenopathy. Left pleural effusion. 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 Needle biopsy of the left fifth rib lesion should provide diagnosis with relatively low risk. Left adrenal mass has enlarged. Small liver lesion has enlarged. Aortic Atherosclerosis (ICD10-I70.0) and Emphysema (ICD10-J43.9). Electronically Signed   By: Marybelle Killings M.D.   On: 11/26/2017 10:21   Ct Abdomen Pelvis W Contrast  Result Date: 12/13/2017 CLINICAL DATA:  Squamous cell skin cancer with left flank and back pain. EXAM: CT ABDOMEN AND PELVIS WITH CONTRAST TECHNIQUE: Multidetector CT imaging of the abdomen and pelvis was performed using the standard protocol following bolus administration of intravenous contrast. CONTRAST:  10m ISOVUE-300 IOPAMIDOL (ISOVUE-300) INJECTION 61% COMPARISON:  Noncontrast CT abdomen and pelvis from 01/27/2005. FINDINGS: Lower chest: Complex left pleural effusion with abnormal enhancement in the left infrahilar region and left lung better demonstrated on recent CT chest. Hepatobiliary: 9 mm hypoattenuating lesion in the anterior dome of liver is not fully characterize. Liver parenchyma otherwise unremarkable. No evidence for calcified gallstones. No intrahepatic or extrahepatic biliary dilation. Pancreas: 1.8 cm hypoattenuating lesion is identified in the pancreas near the junction of the body and tail. No dilatation of the main pancreatic duct. Spleen: No splenomegaly. No focal mass lesion.  Adrenals/Urinary Tract: Right adrenal gland unremarkable. 4.5 cm left adrenal mass shows heterogeneous enhancement and cannot be characterized as adenoma. 8 mm low-density lesion upper pole right kidney is likely a cyst. No suspicious abnormality in the left kidney. No evidence for hydroureter. The urinary bladder appears normal for the degree of distention. Stomach/Bowel: Stomach is nondistended. No gastric wall thickening. No evidence of outlet obstruction. Duodenum is normally positioned as is the ligament of Treitz. No small bowel wall thickening. No small bowel dilatation. The terminal ileum is normal. The appendix is normal. No gross colonic mass. No colonic wall thickening. No substantial diverticular change. Vascular/Lymphatic: There is abdominal aortic atherosclerosis without aneurysm. There is no gastrohepatic or hepatoduodenal ligament lymphadenopathy. No intraperitoneal or retroperitoneal lymphadenopathy. 9 mm short axis left para-aortic lymph node is seen on image 44 of series 2. No  pelvic sidewall lymphadenopathy. Reproductive: Asymmetric enlargement and enhancement identified in the left seminal vesicle. Other: No intraperitoneal free fluid. 9 mm retroperitoneal nodule identified just cranial to the right kidney. 10 mm retroperitoneal nodule identified along the left psoas muscle on image 56 series 2. Musculoskeletal: Pathologic fracture noted in the L4 vertebral body with evidence of paraspinal tumor extending into the left psoas muscle. Tumor extension into the anterior epidural space is visible on image 53 of series 2. 14 mm enhancing lesion in the left gluteal muscles on image 67 is consistent with metastatic disease. There is a similar 11 mm enhancing lesion in the right gluteus musculature on the same image. 19 mm enhancing inferior right gluteal lesion is seen on image 103. 14 mm lesion identified posterior right abdominal wall musculature on image 50. Enhancing lesions are identified in the  paraspinal musculature on images 60, 23, and 16. IMPRESSION: 1. Abnormal soft tissue in the left hemithorax with left pleural effusion better characterized on CT scan of the chest performed earlier today. 2. 4.5 cm left adrenal mass concerning for metastatic disease. 3. 1.8 cm hypoenhancing lesion in the pancreas near the junction of body and tail. Metastatic disease likely although primary pancreatic neoplasm not excluded. 4. Pathologic fracture at L4 with extra-spinal tumor extension into the left psoas muscle and anterior epidural space of the spinal canal. 5. Scattered metastatic deposits in the paraspinal muscles, right abdominal wall musculature and bilateral gluteal musculature. 6. Asymmetric enlargement and enhancement of the left seminal vesicle concerning for metastatic involvement. Electronically Signed   By: Misty Stanley M.D.   On: 11/25/2017 14:21        Scheduled Meds: . dexamethasone  4 mg Oral Q8H  . feeding supplement (ENSURE ENLIVE)  237 mL Oral BID BM  . HYDROcodone-acetaminophen  1 tablet Oral QID  . ketorolac  30 mg Intravenous Q6H   Continuous Infusions: . sodium chloride 100 mL/hr at 12/09/2017 1800  . piperacillin-tazobactam (ZOSYN)  IV 3.375 g (12/23/17 0900)  . vancomycin Stopped (12/23/17 0234)     LOS: 1 day    Time spent: 30 minutes    Edwin Dada, MD [facility] 12/23/2017, 9:37 AM     Pager [phone] --- please page though AMION:  [email or web address] If 7PM-7AM, please contact night-coverage

## 2017-12-23 NOTE — Progress Notes (Signed)
Pharmacy Antibiotic Note  Randall Bauer is a 66 y.o. male admitted on 12/05/2017 with sepsis.  Pharmacy was been consulted for Vancomycin & Zosyn dosing.  Initial doses given in ED. On 12/30,Vanc and Zosyn d/c'ed and pharmacy consulted to dose Ceftriaxone for CAP.  12/23/2017:  Afebrile  WBC elevated but trending down  Renal function at baseline  Plan:  Ceftriaxone 1gm IV q24h Need for further dosage adjustment appears unlikely at present.   Will sign off at this time.  Please reconsult if a change in clinical status warrants re-evaluation of dosage.  Height: 5\' 10"  (177.8 cm)(he thinks, he is unable to stand) Weight: (pt can't straighten legs out to weigh in bed) IBW/kg (Calculated) : 73  Temp (24hrs), Avg:98.5 F (36.9 C), Min:98.3 F (36.8 C), Max:98.6 F (37 C)  Recent Labs  Lab 11/30/2017 0822 12/13/2017 0846 12/09/2017 1131 11/25/2017 1519 12/23/17 0502  WBC 25.1*  --   --  12.8*  --   CREATININE 0.86  --   --  0.64 0.68  LATICACIDVEN  --  7.05* 1.92*  --   --     CrCl cannot be calculated (Unknown ideal weight.).    No Known Allergies  Antimicrobials this admission: 12/29 Vanc >> 12/30 12/29 Zosyn >> 12/30 12/30 Ceftriaxone >>  Dose adjustments this admission:  Microbiology results: 12/29 BCx:  12/29 MRSA PCR:  Thank you for allowing pharmacy to be a part of this patient's care.  Everette Rank, PharmD 12/23/2017 9:50 AM

## 2017-12-23 NOTE — H&P (Signed)
Chief Complaint: Patient was seen in consultation today for  Chief Complaint  Patient presents with  . Back Pain  . URI    Referring Physician(s): Edwin Dada  Supervising Physician: Marybelle Killings  Patient Status: [facility] - In-pt  History of Present Illness: Randall Bauer is a 66 y.o. male with past medical history significant for smoking, SCC of the face and CAD s/p STEMI/PCI in 2005 as well as active smoking.  He presented to the ED yesterday coughing up blood and left lower back pain with weakness in left leg.  CT chest showed lung mass and post-obstructive pneumonia.    Follow up CT head and Abd/Pel shows brain met, large adrenal met, and numerous lytic bone lesions, nodes, and small masses in the  paraspinal muscles.  We are asked to evaluate imaging for a biopsy for tissue diagnosis.  Pertinent ROS: weight loss, quite rapid over the last 2 months of about 40-50 lbs with loss of appetite and generalized weakness.   Past Medical History:  Diagnosis Date  . 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 melanoma removal      Allergies: Patient has no known allergies.  Medications: Prior to Admission medications   Medication Sig Start Date End Date Taking? Authorizing Provider  ibuprofen (ADVIL,MOTRIN) 800 MG tablet Take 800 mg by mouth every 8 (eight) hours as needed for mild pain or moderate pain.   Yes [provider]  naproxen (NAPROSYN) 500 MG tablet Take 1 tablet (500 mg total) by mouth 2 (two) times daily as needed for mild pain or moderate pain. With meals 11/22/17   Suzan Slick, NP  oxyCODONE-acetaminophen (ROXICET) 5-325 MG tablet Take 1 tablet by mouth every 8 (eight) hours as needed for severe pain. 12/07/17   Suzan Slick, NP  predniSONE (DELTASONE) 20 MG tablet TK 3 TS PO TOGETHER AS ONE DOSE FOR 5 DAYS 11/16/17   [provider]     History  reviewed. No pertinent family history.  Social History   Socioeconomic History  . Marital status: Single    Spouse name: None  . Number of children: None  . Years of education: None  . Highest education level: None  Social Needs  . Financial resource strain: None  . Food insecurity - worry: None  . Food insecurity - inability: None  . Transportation needs - medical: None  . Transportation needs - non-medical: None  Occupational History  . None  Tobacco Use  . Smoking status: Current Every Day Smoker    Packs/day: 0.50    Types: Cigarettes  . Smokeless tobacco: Never Used  Substance and Sexual Activity  . Alcohol use: No    Frequency: Never  . Drug use: No  . Sexual activity: None  Other Topics Concern  . None  Social History Narrative  . None   Review of Systems: A 12 point ROS discussed and pertinent positives are indicated in the HPI above.  All other systems are negative.  Review of Systems  Vital Signs: BP 116/75 (BP Location: Right Arm)   Pulse 81   Temp 98.3 F (36.8 C) (Oral)   Resp 18   Ht _0  (1.778 m) Comment: he thinks, he is unable to stand  SpO2 98%   BMI 28.98 kg/m   Physical Exam  Constitutional: He is oriented to person, place, and time. He appears well-developed.  HENT:  Head: Normocephalic and atraumatic.  Eyes: EOM are normal.  Neck: Normal range of motion.  Cardiovascular: Normal rate, regular rhythm and normal heart sounds.  Pulmonary/Chest: Effort normal and breath sounds normal.  Abdominal: Soft. There is no tenderness.  Musculoskeletal: Normal range of motion.  Neurological: He is alert and oriented to person, place, and time.  Skin: Skin is warm and dry.  Psychiatric: He has a normal mood and affect. His behavior is normal. Judgment and thought content normal.  Vitals reviewed.   Imaging: Dg Chest 2 View  Result Date: 12/15/2017 CLINICAL DATA:  Worsening back pain and shortness of breath. EXAM: CHEST  2 VIEW COMPARISON:   None. FINDINGS: Two-view exam shows complete whiteout of the left hemithorax with evidence of some associated left hemithoracic volume loss. Extreme right lung apex not included on the film, but visualized portions of the right lung are clear. The visualized bony structures of the thorax are intact. IMPRESSION: Complete opacification of the left hemithorax with some associated volume loss. Central obstructing lesion with pleural effusion a distinct concern. Dedicated CT chest with IV contrast recommended to further evaluate. Electronically Signed   By: Misty Stanley M.D.   On: 12/01/2017 09:48   Ct Head Wo Contrast  Addendum Date: 11/24/2017   ADDENDUM REPORT: 12/04/2017 15:52 ADDENDUM: These results were called by telephone at the time of interpretation on 12/20/2017 at 3:52 pm to Dr. Debbe Odea , who verbally acknowledged these results. Electronically Signed   By: Franchot Gallo M.D.   On: 12/08/2017 15:52   Result Date: 12/04/2017 CLINICAL DATA:  Non-small-cell lung cancer staging. History of melanoma. EXAM: CT HEAD WITHOUT CONTRAST TECHNIQUE: Contiguous axial images were obtained from the base of the skull through the vertex without intravenous contrast. COMPARISON:  None. FINDINGS: Brain: Hyperdense suprasellar mass lesion measures 18 x 23 x 14 mm. It is difficult determine if this extends into the sella. The sella is not enlarged. The mass is heterogeneous in density but predominantly hyperdense. No associated calcifications. There is compression of the optic chiasm. No definite invasion of the cavernous sinus. Generalized atrophy. Chronic microvascular ischemic changes in the white matter. Negative for acute ischemic infarction Vascular: Negative for hyperdense vessel. Atherosclerotic calcification. Skull: Negative Sinuses/Orbits: Mild mucosal edema left maxillary sinus. Normal orbit. Other: None IMPRESSION: Hyperdense suprasellar mass measuring 18 x 23 x 14 mm. It is difficult determine if this  extends into the sella. Differential diagnosis includes pituitary macro adenoma, possibly with hemorrhage. Giant aneurysm in the differential. Meningioma, Rathke's cleft cyst, and craniopharyngioma in the differential. Metastatic disease, possibly with hemorrhage also in the differential. Further evaluation with MRI of brain without with contrast suggested for further evaluation. Electronically Signed: By: Franchot Gallo M.D. On: 12/02/2017 14:21   Ct Angio Chest Pe W/cm &/or Wo Cm  Result Date: 12/09/2017 CLINICAL DATA:  Hemoptysis EXAM: CT ANGIOGRAPHY CHEST WITH CONTRAST TECHNIQUE: Multidetector CT imaging of the chest was performed using the standard protocol during bolus administration of intravenous contrast. Multiplanar CT image reconstructions and MIPs were obtained to evaluate the vascular anatomy. CONTRAST:  175m ISOVUE-370 IOPAMIDOL (ISOVUE-370) INJECTION 76% COMPARISON:  CT abdomen and pelvis 01/27/2005 FINDINGS: Cardiovascular: There are no filling defects in the pulmonary arterial tree to suggest acute pulmonary thromboembolism. The main and right pulmonary artery are normal in caliber. There is severe narrowing of the left main pulmonary artery secondary to suspected malignancy. There are no opacified pulmonary arterial branches in the mid and lower left lung. Aortic atherosclerotic calcifications  are noted. No evidence of aortic aneurysm or dissection. Atherosclerotic calcification and irregular soft plaque is evident in the descending thoracic aorta and upper abdominal aorta. Three vessel coronary artery calcification. Atherosclerotic calcifications at the origin of the right subclavian artery. Mediastinum/Nodes: Abnormal mediastinal adenopathy is worrisome for malignancy. 2.0 cm precarinal node. 1.9 cm right hilar node. 5.3 cm subcarinal mass is continuous with mass effect throughout the left lung. Several smaller prevascular nodes to the left of and anterior to the aortic arch. Unremarkable  thyroid. The midesophagus becomes inconspicuous and inseparable from the subcarinal mass. See image 46 of series 4. Lungs/Pleura: The entire left lung is opacified and abnormal in appearance. There are no air bronchograms. Minimal pulmonary arterial branches in the mid and lower left lung. These findings are worrisome for diffuse malignancy throughout the left lung. The left mainstem bronchus is occluded 3 cm beyond its origin. Left upper and lower lobe lobar airways are occluded. Small left pleural effusion. No pneumothorax. Scattered moderate emphysema within the right upper lobe. Upper Abdomen: Large left adrenal mass has increased in size since the prior study. There is heterogeneous enhancement and calcification. Maximal diameter is 4.0 cm. 9 mm anterior right lobe liver lesion towards the dome on image 83 is larger than on the prior study. Musculoskeletal: T7 wedge compression deformity is present. There are lytic areas within the bone worrisome for pathologic fracture. Minimal retropulsion of the mid posterior wall is 3 mm. Destructive lytic lesion in the left posterior fifth rib. Review of the MIP images confirms the above findings. IMPRESSION: No evidence of acute pulmonary thromboembolism Findings are worrisome for malignancy throughout the left lung extending into the mediastinum. There is mediastinal and right hilar adenopathy. Left pleural effusion. 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 Needle biopsy of the left fifth rib lesion should provide diagnosis with relatively low risk. Left adrenal mass has enlarged. Small liver lesion has enlarged. Aortic Atherosclerosis (ICD10-I70.0) and Emphysema (ICD10-J43.9). Electronically Signed   By: Marybelle Killings M.D.   On: 12/07/2017 10:21   Ct Abdomen Pelvis W Contrast  Result Date: 12/06/2017 CLINICAL DATA:  Squamous cell skin cancer with left flank and  back pain. EXAM: CT ABDOMEN AND PELVIS WITH CONTRAST TECHNIQUE: Multidetector CT imaging of the abdomen and pelvis was performed using the standard protocol following bolus administration of intravenous contrast. CONTRAST:  33m ISOVUE-300 IOPAMIDOL (ISOVUE-300) INJECTION 61% COMPARISON:  Noncontrast CT abdomen and pelvis from 01/27/2005. FINDINGS: Lower chest: Complex left pleural effusion with abnormal enhancement in the left infrahilar region and left lung better demonstrated on recent CT chest. Hepatobiliary: 9 mm hypoattenuating lesion in the anterior dome of liver is not fully characterize. Liver parenchyma otherwise unremarkable. No evidence for calcified gallstones. No intrahepatic or extrahepatic biliary dilation. Pancreas: 1.8 cm hypoattenuating lesion is identified in the pancreas near the junction of the body and tail. No dilatation of the main pancreatic duct. Spleen: No splenomegaly. No focal mass lesion. Adrenals/Urinary Tract: Right adrenal gland unremarkable. 4.5 cm left adrenal mass shows heterogeneous enhancement and cannot be characterized as adenoma. 8 mm low-density lesion upper pole right kidney is likely a cyst. No suspicious abnormality in the left kidney. No evidence for hydroureter. The urinary bladder appears normal for the degree of distention. Stomach/Bowel: Stomach is nondistended. No gastric wall thickening. No evidence of outlet obstruction. Duodenum is normally positioned as is the ligament of Treitz. No small bowel wall  thickening. No small bowel dilatation. The terminal ileum is normal. The appendix is normal. No gross colonic mass. No colonic wall thickening. No substantial diverticular change. Vascular/Lymphatic: There is abdominal aortic atherosclerosis without aneurysm. There is no gastrohepatic or hepatoduodenal ligament lymphadenopathy. No intraperitoneal or retroperitoneal lymphadenopathy. 9 mm short axis left para-aortic lymph node is seen on image 44 of series 2. No  pelvic sidewall lymphadenopathy. Reproductive: Asymmetric enlargement and enhancement identified in the left seminal vesicle. Other: No intraperitoneal free fluid. 9 mm retroperitoneal nodule identified just cranial to the right kidney. 10 mm retroperitoneal nodule identified along the left psoas muscle on image 56 series 2. Musculoskeletal: Pathologic fracture noted in the L4 vertebral body with evidence of paraspinal tumor extending into the left psoas muscle. Tumor extension into the anterior epidural space is visible on image 53 of series 2. 14 mm enhancing lesion in the left gluteal muscles on image 67 is consistent with metastatic disease. There is a similar 11 mm enhancing lesion in the right gluteus musculature on the same image. 19 mm enhancing inferior right gluteal lesion is seen on image 103. 14 mm lesion identified posterior right abdominal wall musculature on image 50. Enhancing lesions are identified in the paraspinal musculature on images 60, 23, and 16. IMPRESSION: 1. Abnormal soft tissue in the left hemithorax with left pleural effusion better characterized on CT scan of the chest performed earlier today. 2. 4.5 cm left adrenal mass concerning for metastatic disease. 3. 1.8 cm hypoenhancing lesion in the pancreas near the junction of body and tail. Metastatic disease likely although primary pancreatic neoplasm not excluded. 4. Pathologic fracture at L4 with extra-spinal tumor extension into the left psoas muscle and anterior epidural space of the spinal canal. 5. Scattered metastatic deposits in the paraspinal muscles, right abdominal wall musculature and bilateral gluteal musculature. 6. Asymmetric enlargement and enhancement of the left seminal vesicle concerning for metastatic involvement. Electronically Signed   By: Misty Stanley M.D.   On: 12/01/2017 14:21    Labs:  CBC: Recent Labs    12/01/2017 0822 12/04/2017 1519  WBC 25.1* 12.8*  HGB 12.5* 10.1*  HCT 36.7* 29.8*  PLT 428* 220     COAGS: Recent Labs    11/29/2017 0937  INR 1.29    BMP: Recent Labs    12/08/2017 0822 12/21/2017 1519 12/23/17 0502  NA 130*  --  133*  K 3.0*  --  3.8  CL 89*  --  100*  CO2 22  --  25  GLUCOSE 169*  --  164*  BUN 16  --  17  CALCIUM 9.4  --  8.6*  CREATININE 0.86 0.64 0.68  GFRNONAA >60 >60 >60  GFRAA >60 >60 >60    LIVER FUNCTION TESTS: Recent Labs    11/28/2017 0822 12/23/17 0502  BILITOT 1.3* 0.7  AST 46* 26  ALT 34 26  ALKPHOS 123 91  PROT 7.5 5.6*  ALBUMIN 2.5* 1.9*    TUMOR MARKERS: No results for input(s): AFPTM, CEA, CA199, CHROMGRNA in the last 8760 hours.  Assessment and Plan:  CT  Abd/Pelvis shows a large adrenal met, numerous lytic bone lesions, nodes, and small masses in the  paraspinal muscles.  Dr. Barbie Banner reviewed images.  Safest plan would be to biopsy the lytic lesion on the left rib. Bronchoscopy is an option as well.  Will plan for rib biopsy tomorrow.  Risks and benefits discussed with the patient including, but not limited to bleeding, infection, damage to adjacent structures  or low yield requiring additional tests. All of the patient's questions were answered, patient is agreeable to proceed. Consent signed and in chart.  Thank you for this interesting consult.  I greatly enjoyed meeting Randall Bauer and look forward to participating in their care.  A copy of this report was sent to the requesting provider on this date.  Electronically Signed: Murrell Redden, PA-C 12/23/2017, 10:58 AM   I spent a total of 40 Minutes in face to face in clinical consultation, greater than 50% of which was counseling/coordinating care for rib biopsy.

## 2017-12-24 ENCOUNTER — Encounter (HOSPITAL_COMMUNITY): Payer: Self-pay | Admitting: Radiology

## 2017-12-24 ENCOUNTER — Inpatient Hospital Stay (HOSPITAL_COMMUNITY): Payer: Medicare Other

## 2017-12-24 LAB — BLOOD GAS, ARTERIAL
Acid-base deficit: 7.4 mmol/L — ABNORMAL HIGH (ref 0.0–2.0)
Bicarbonate: 18.9 mmol/L — ABNORMAL LOW (ref 20.0–28.0)
Delivery systems: POSITIVE
Drawn by: 308601
Expiratory PAP: 6
FIO2: 100
Inspiratory PAP: 14
Mode: POSITIVE
O2 Saturation: 96.4 %
Patient temperature: 37
pCO2 arterial: 43.8 mmHg (ref 32.0–48.0)
pH, Arterial: 7.259 — ABNORMAL LOW (ref 7.350–7.450)
pO2, Arterial: 112 mmHg — ABNORMAL HIGH (ref 83.0–108.0)

## 2017-12-24 LAB — BASIC METABOLIC PANEL
ANION GAP: 7 (ref 5–15)
BUN: 23 mg/dL — ABNORMAL HIGH (ref 6–20)
CO2: 28 mmol/L (ref 22–32)
Calcium: 9 mg/dL (ref 8.9–10.3)
Chloride: 97 mmol/L — ABNORMAL LOW (ref 101–111)
Creatinine, Ser: 0.64 mg/dL (ref 0.61–1.24)
GFR calc Af Amer: >60 mL/min (ref >60)
GLUCOSE: 159 mg/dL — AB (ref 65–99)
POTASSIUM: 3.5 mmol/L (ref 3.5–5.1)
Sodium: 132 mmol/L — ABNORMAL LOW (ref 135–145)

## 2017-12-24 LAB — CBC
HEMATOCRIT: 26.7 % — AB (ref 39.0–52.0)
Hemoglobin: 9 g/dL — ABNORMAL LOW (ref 13.0–17.0)
MCH: 28.2 pg (ref 26.0–34.0)
MCHC: 33.7 g/dL (ref 30.0–36.0)
MCV: 83.7 fL (ref 78.0–100.0)
PLATELETS: 197 10*3/uL (ref 150–400)
RBC: 3.19 MIL/uL — AB (ref 4.22–5.81)
RDW: 14.4 % (ref 11.5–15.5)
WBC: 11.7 10*3/uL — AB (ref 4.0–10.5)

## 2017-12-24 LAB — PROCALCITONIN: Procalcitonin: 0.51 ng/mL

## 2017-12-24 MED ORDER — LORAZEPAM 2 MG/ML IJ SOLN
1.0000 mg | Freq: Once | INTRAMUSCULAR | Status: AC
  Administered 2017-12-24: 1 mg via INTRAVENOUS

## 2017-12-24 MED ORDER — MIDAZOLAM HCL 2 MG/2ML IJ SOLN
INTRAMUSCULAR | Status: AC
  Filled 2017-12-24: qty 4

## 2017-12-24 MED ORDER — LORAZEPAM 2 MG/ML IJ SOLN
INTRAMUSCULAR | Status: AC
  Filled 2017-12-24: qty 1

## 2017-12-24 MED ORDER — FUROSEMIDE 10 MG/ML IJ SOLN
INTRAMUSCULAR | Status: AC
  Filled 2017-12-24: qty 2

## 2017-12-24 MED ORDER — SODIUM CHLORIDE 0.9 % IV SOLN
INTRAVENOUS | Status: AC
  Filled 2017-12-24: qty 250

## 2017-12-24 MED ORDER — METOPROLOL TARTRATE 5 MG/5ML IV SOLN
INTRAVENOUS | Status: AC
  Filled 2017-12-24: qty 5

## 2017-12-24 MED ORDER — FENTANYL CITRATE (PF) 100 MCG/2ML IJ SOLN
INTRAMUSCULAR | Status: AC
  Filled 2017-12-24: qty 4

## 2017-12-24 MED ORDER — LORAZEPAM 2 MG/ML IJ SOLN
INTRAMUSCULAR | Status: AC
  Administered 2017-12-25: 2 mg via INTRAVENOUS
  Filled 2017-12-24: qty 1

## 2017-12-24 MED ORDER — LORAZEPAM 2 MG/ML IJ SOLN
1.0000 mg | Freq: Once | INTRAMUSCULAR | Status: AC
  Filled 2017-12-24: qty 1

## 2017-12-24 MED ORDER — FUROSEMIDE 10 MG/ML IJ SOLN
40.0000 mg | Freq: Once | INTRAMUSCULAR | Status: AC
Start: 2017-12-24 — End: 2017-12-24
  Administered 2017-12-24: 40 mg via INTRAVENOUS

## 2017-12-24 MED ORDER — LORAZEPAM 2 MG/ML IJ SOLN
2.0000 mg | Freq: Once | INTRAMUSCULAR | Status: AC
  Administered 2017-12-24: 2 mg via INTRAMUSCULAR

## 2017-12-24 MED ORDER — METOPROLOL TARTRATE 5 MG/5ML IV SOLN
5.0000 mg | Freq: Once | INTRAVENOUS | Status: AC
  Administered 2017-12-24: 5 mg via INTRAVENOUS

## 2017-12-24 MED ORDER — METOPROLOL TARTRATE 5 MG/5ML IV SOLN
5.0000 mg | Freq: Once | INTRAVENOUS | Status: AC
  Administered 2017-12-25: 5 mg via INTRAVENOUS

## 2017-12-24 NOTE — Progress Notes (Signed)
Patient oxygen stats trending in mid to high 80's. 2 liters of oxygen per nasal cannula were applied. Oxygen stats rose to 92-93%. Shortly after [place] placement patient began complaining of it "bothering him". He removed the [place] and was educated, but still refused [place] placement. Will continue to monitor.

## 2017-12-24 NOTE — Sedation Documentation (Signed)
Iv infiltrated. bx is superficial and pt elected to have procedure without sedation.

## 2017-12-24 NOTE — Progress Notes (Addendum)
PROGRESS NOTE    Randall Bauer  ZWC:[id number] DOB: [birth year] DOA: 12/24/2017 PCP: Patient, No Pcp Per   Brief Narrative: Patient is a 66 year old male with past medical history of squamous cell carcinoma of the face, coronary artery disease, status post STEMI/PCI in 2005, active smoker who presents to the emergency department with weakness and hemoptysis. CT chest showed lung mass and postobstructive pneumonia.  Follow-up CT head and abdomen/pelvis showed brain metastasis , large adrenal metastasis and new  lytic bone lesions,nodes and  small masses in the paraspinal muscles.  Assessment & Plan:   Principal Problem:   Hemoptysis Active Problems:   CAD (coronary artery disease), native coronary artery- with one stent   Lumbar back pain   Collapse of left lung   Lytic bone lesions on xray   Adrenal mass (HCC)    Mass/lytic bone lesion/ Adrenal mass/paraspinal masses/brain mass: S/P IR guided biopsy of the nodule within the subcutaneous tissues of the right inferior lateral flank We will check the biopsy report and call oncology consult . Pulmonology currently signed off and recommended to follow-up biopsy  . Patient does not have any PCP. Continue dexamethasone for brain mass.Will order MRI brain CT imaging summary: -4.5 cm left adrenal mass concerning for metastatic disease. -1.8 cm hypoenhancing lesion in the pancreas near the junction of body and tail. Metastatic disease likely although primary pancreatic neoplasm not excluded. -Pathologic fracture at L4 with extra-spinal tumor extension into the left psoas muscle and anterior epidural space of the spinal canal. -Scattered metastatic deposits in the paraspinal muscles, right abdominal wall musculature and bilateral gluteal musculature. -Asymmetric enlargement and enhancement of the left seminal vesicle concerning for metastatic involvement -Hyperdense suprasellar mass measuring 18 x 23 x 14 mm -Findings are worrisome for  malignancy throughout the left lung extending into the mediastinum. There is mediastinal and right hilar adenopathy.Left pleural effusion. -Destructive bony metastatic disease including the T7 vertebral body with a pathologic fracture and a destructive process of the left fifth rib.   Lumbar compression fracture: Pathological lytic  Lesion of L4.  IR consulted. Pain management.    Postobstructive pneumonia: From lung mass.  Lactate was initially elevated more than 7, pro calcitonin elevated.  Currently on ceftriaxone.  Coronary disease: Aspirin on hold due to hemorrhagic brain lesion  Hyponatremia/hypokalemia: We will continue to monitor the electrolytes level  Anemia: Mild.  Likely is related with malignancy.  We will continue to monitor H&H  Generalized weakness: Patient will be  evaluated by physical therapy .   DVT prophylaxis: Scd Code Status: DNR/DNI Family Communication: None Disposition Plan: SNF   Consultants: IR,Pulmonary  Procedures:Biopsy of the right lateral flank  Antimicrobials: Ceftriaxone since 12/30  Subjective: Patient seen and examined the bedside this  afternoon.  Feels okay.  His voice is hoarse.  Overall he looks more comfortable.  Underwent biopsy today.  Objective: Vitals:   12/24/17 1047 12/24/17 1348 12/24/17 1414 12/24/17 1513  BP: 139/79 114/69 110/63 116/68  Pulse: 90  90 88  Resp: 20 18 18 18   Temp:  98.3 F (36.8 C) 98.2 F (36.8 C) 97.9 F (36.6 C)  TempSrc:  Oral Oral Oral  SpO2: 98% 93% 93% 91%  Height:        Intake/Output Summary (Last 24 hours) at 12/24/2017 1518 Last data filed at 12/24/2017 1400 Gross per 24 hour  Intake 890 ml  Output 1400 ml  Net -510 ml   Filed Weights    Examination:  General exam: Appears  comfortable ,Not in distress,average built Respiratory system: Decreased air entry on the left side  cardiovascular system: S1 & S2 heard, RRR. No JVD, murmurs, rubs, gallops or clicks. No pedal  edema. Gastrointestinal system: Abdomen is nondistended, soft and nontender. No organomegaly or masses felt. Normal bowel sounds heard. Central nervous system: Alert and oriented. No focal neurological deficits. Extremities: No edema, no clubbing ,no cyanosis, distal peripheral pulses palpable. Skin: No rashes, lesions or ulcers,no icterus ,no pallor Psychiatry: Judgement and insight appear normal. Mood & affect appropriate.     Data Reviewed: I have personally reviewed following labs and imaging studies  CBC: Recent Labs  Lab 11/25/2017 0822 12/06/2017 1519 12/24/17 0444  WBC 25.1* 12.8* 11.7*  NEUTROABS 18.7*  --   --   HGB 12.5* 10.1* 9.0*  HCT 36.7* 29.8* 26.7*  MCV 82.7 83.0 83.7  PLT 428* 220 517   Basic Metabolic Panel: Recent Labs  Lab 11/28/2017 0822 12/14/2017 1519 12/23/17 0502 12/24/17 0444  NA 130*  --  133* 132*  K 3.0*  --  3.8 3.5  CL 89*  --  100* 97*  CO2 22  --  25 28  GLUCOSE 169*  --  164* 159*  BUN 16  --  17 23*  CREATININE 0.86 0.64 0.68 0.64  CALCIUM 9.4  --  8.6* 9.0   GFR: CrCl cannot be calculated (Unknown ideal weight.). Liver Function Tests: Recent Labs  Lab 12/05/2017 0822 12/23/17 0502  AST 46* 26  ALT 34 26  ALKPHOS 123 91  BILITOT 1.3* 0.7  PROT 7.5 5.6*  ALBUMIN 2.5* 1.9*   No results for input(s): LIPASE, AMYLASE in the last 168 hours. No results for input(s): AMMONIA in the last 168 hours. Coagulation Profile: Recent Labs  Lab 12/04/2017 0937  INR 1.29   Cardiac Enzymes: Recent Labs  Lab 11/24/2017 0937  TROPONINI <0.03   BNP (last 3 results) No results for input(s): PROBNP in the last 8760 hours. HbA1C: No results for input(s): HGBA1C in the last 72 hours. CBG: No results for input(s): GLUCAP in the last 168 hours. Lipid Profile: No results for input(s): CHOL, HDL, LDLCALC, TRIG, CHOLHDL, LDLDIRECT in the last 72 hours. Thyroid Function Tests: No results for input(s): TSH, T4TOTAL, FREET4, T3FREE, THYROIDAB in the  last 72 hours. Anemia Panel: No results for input(s): VITAMINB12, FOLATE, FERRITIN, TIBC, IRON, RETICCTPCT in the last 72 hours. Sepsis Labs: Recent Labs  Lab 12/01/2017 0846 12/24/2017 1131 12/11/2017 1519 12/23/17 0502 12/24/17 0444  PROCALCITON  --   --  0.83 0.66 0.51  LATICACIDVEN 7.05* 1.92*  --   --   --     Recent Results (from the past 240 hour(s))  Blood Culture (routine x 2)     Status: None (Preliminary result)   Collection Time: 12/21/2017  9:37 AM  Result Value Ref Range Status   Specimen Description BLOOD LEFT ANTECUBITAL  Final   Special Requests   Final    BOTTLES DRAWN AEROBIC AND ANAEROBIC Blood Culture adequate volume   Culture   Final    NO GROWTH 2 DAYS Performed at [facility], [place]  [address] [place], [place] [address]    Report Status PENDING  Incomplete  Blood Culture (routine x 2)     Status: None (Preliminary result)   Collection Time: 11/28/2017  9:43 AM  Result Value Ref Range Status   Specimen Description BLOOD LEFT ANTECUBITAL  Final   Special Requests   Final    BOTTLES  DRAWN AEROBIC AND ANAEROBIC Blood Culture adequate volume   Culture   Final    NO GROWTH 2 DAYS Performed at [facility], [place] [address], [place], [place] [address]    Report Status PENDING  Incomplete         Radiology Studies: Ct Biopsy  Result Date: 01-04-2018 INDICATION: History of squamous cell carcinoma of the phase, now with CT findings worrisome for advanced multifocal metastatic disease. Please perform CT-guided biopsy of indeterminate nodule within the subcutaneous tissues of the right lateral flank for tissue diagnostic purposes. EXAM: CT-GUIDED BIOPSY OF SUBCUTANEOUS NODULE WITHIN THE RIGHT FLANK COMPARISON:  CT the chest, abdomen and pelvis - 12/18/2017 MEDICATIONS: None. ANESTHESIA/SEDATION: None CONTRAST:  None. COMPLICATIONS: None immediate. PROCEDURE: Informed consent was obtained from the patient following an explanation of the procedure, risks,  benefits and alternatives. A time out was performed prior to the initiation of the procedure. The patient was positioned left lateral decubitus on the CT table and a limited CT was performed for procedural planning demonstrating unchanged appearance of the approximately 1.7 x 1.4 cm nodule within the subcutaneous tissues about the posterolateral aspect the right flank (image 40, series 2). The procedure was planned. The operative site was prepped and draped in the usual sterile fashion. Appropriate trajectory was confirmed with a 22 gauge spinal needle after the adjacent tissues were anesthetized with 1% Lidocaine with epinephrine. Under intermittent CT guidance, a 17 gauge coaxial needle was advanced into the peripheral aspect of the mass. Appropriate positioning was confirmed and 6 core needle biopsy samples were obtained with an 18 gauge core needle biopsy device. The co-axial needle was removed and hemostasis was achieved with manual compression. A limited postprocedural CT was negative for hemorrhage or additional complication. A dressing was placed. The patient tolerated the procedure well without immediate postprocedural complication. IMPRESSION: Technically successful CT guided core needle biopsy of indeterminate nodule within the subcutaneous tissues of the posterolateral aspect of the right flank. Electronically Signed   By: Sandi Mariscal M.D.   On: 01-04-18 11:47        Scheduled Meds: . dexamethasone  4 mg Oral Q8H  . feeding supplement (ENSURE ENLIVE)  237 mL Oral BID BM  . fentaNYL      . HYDROcodone-acetaminophen  1 tablet Oral QID  . ketorolac  30 mg Intravenous Q6H  . midazolam       Continuous Infusions: . cefTRIAXone (ROCEPHIN)  IV Stopped (12/23/17 1746)  . sodium chloride       LOS: 2 days    Time spent: 25 mins    Johntavius Shepard Jodie Echevaria, MD [facility] Pager [phone]  If 7PM-7AM, please contact night-coverage [email or web address] Password [email or web address] 01/04/18, 3:18 PM

## 2017-12-24 NOTE — Procedures (Signed)
Pre procedural Dx: History of SCC of the face, now with evidence of metastatic disease  Post procedural Dx: Same  Technically successful CT guided biopsy of indeterminate nodule within the subcutaneous tissues of the right inferior lateral flank.   EBL: None.   Complications: None immediate.   Ronny Bacon, MD Pager #: [phone]

## 2017-12-24 NOTE — Progress Notes (Signed)
  IR notes reviewd: plan for rib biopsy 12/24/2017  rec from pulm  - await bx results from rib bx  - if non diagnostic or oncology wants more tisse- call pccm back  - pccm will sign off in interim   Dr. Brand Males, M.D., [facility].C.P Pulmonary and Critical Care Medicine Staff Physician, [place] Director - Interstitial Lung Disease  Program  Pulmonary [place] [facility] [place], [place], [address]  Pager: [phone], If no answer or between  15:00h - 7:00h: call [phone] Telephone: [phone]

## 2017-12-24 NOTE — Progress Notes (Signed)
PT Cancellation Note  Patient Details Name: Randall Bauer MRN: [id number] DOB: Sep 06, 1951   Cancelled Treatment:    Reason Eval/Treat Not Completed: Patient not medically ready, Post biopsy today. Will check back tomorrow.   Claretha Cooper 12/24/2017, 2:17 PM  Tresa Endo PT [phone]

## 2017-12-25 DIAGNOSIS — R778 Other specified abnormalities of plasma proteins: Secondary | ICD-10-CM

## 2017-12-25 DIAGNOSIS — J189 Pneumonia, unspecified organism: Secondary | ICD-10-CM

## 2017-12-25 DIAGNOSIS — R7989 Other specified abnormal findings of blood chemistry: Secondary | ICD-10-CM

## 2017-12-25 DIAGNOSIS — F411 Generalized anxiety disorder: Secondary | ICD-10-CM

## 2017-12-25 DIAGNOSIS — J9601 Acute respiratory failure with hypoxia: Secondary | ICD-10-CM

## 2017-12-25 DIAGNOSIS — Z515 Encounter for palliative care: Secondary | ICD-10-CM

## 2017-12-25 DIAGNOSIS — D72829 Elevated white blood cell count, unspecified: Secondary | ICD-10-CM

## 2017-12-25 DIAGNOSIS — C799 Secondary malignant neoplasm of unspecified site: Secondary | ICD-10-CM

## 2017-12-25 DIAGNOSIS — R0602 Shortness of breath: Secondary | ICD-10-CM

## 2017-12-25 DIAGNOSIS — Z7189 Other specified counseling: Secondary | ICD-10-CM

## 2017-12-25 LAB — CBC WITH DIFFERENTIAL/PLATELET
BASOS ABS: 0 10*3/uL (ref 0.0–0.1)
BASOS PCT: 0 %
EOS PCT: 0 %
Eosinophils Absolute: 0 10*3/uL (ref 0.0–0.7)
HCT: 31.8 % — ABNORMAL LOW (ref 39.0–52.0)
Hemoglobin: 10.2 g/dL — ABNORMAL LOW (ref 13.0–17.0)
Lymphocytes Relative: 8 %
Lymphs Abs: 1.6 10*3/uL (ref 0.7–4.0)
MCH: 27.2 pg (ref 26.0–34.0)
MCHC: 32.1 g/dL (ref 30.0–36.0)
MCV: 84.8 fL (ref 78.0–100.0)
MONO ABS: 0.8 10*3/uL (ref 0.1–1.0)
MONOS PCT: 4 %
Neutro Abs: 18.9 10*3/uL — ABNORMAL HIGH (ref 1.7–7.7)
Neutrophils Relative %: 88 %
PLATELETS: 338 10*3/uL (ref 150–400)
RBC: 3.75 MIL/uL — ABNORMAL LOW (ref 4.22–5.81)
RDW: 14.7 % (ref 11.5–15.5)
WBC: 21.3 10*3/uL — ABNORMAL HIGH (ref 4.0–10.5)

## 2017-12-25 LAB — BASIC METABOLIC PANEL
Anion gap: 11 (ref 5–15)
BUN: 28 mg/dL — AB (ref 6–20)
CALCIUM: 8.5 mg/dL — AB (ref 8.9–10.3)
CO2: 29 mmol/L (ref 22–32)
Chloride: 95 mmol/L — ABNORMAL LOW (ref 101–111)
Creatinine, Ser: 0.94 mg/dL (ref 0.61–1.24)
GFR calc Af Amer: >60 mL/min (ref >60)
Glucose, Bld: 221 mg/dL — ABNORMAL HIGH (ref 65–99)
Potassium: 3 mmol/L — ABNORMAL LOW (ref 3.5–5.1)
Sodium: 135 mmol/L (ref 135–145)

## 2017-12-25 LAB — GLUCOSE, CAPILLARY
GLUCOSE-CAPILLARY: 157 mg/dL — AB (ref 65–99)
GLUCOSE-CAPILLARY: 142 mg/dL — AB (ref 65–99)
Glucose-Capillary: 160 mg/dL — ABNORMAL HIGH (ref 65–99)
Glucose-Capillary: 116 mg/dL — ABNORMAL HIGH (ref 65–99)

## 2017-12-25 LAB — TROPONIN I: Troponin I: 0.09 ng/mL (ref <0.03)

## 2017-12-25 LAB — MRSA PCR SCREENING: MRSA by PCR: NEGATIVE

## 2017-12-25 LAB — MAGNESIUM: MAGNESIUM: 1.8 mg/dL (ref 1.7–2.4)

## 2017-12-25 MED ORDER — MORPHINE SULFATE (PF) 4 MG/ML IV SOLN
4.0000 mg | INTRAVENOUS | Status: DC | PRN
  Administered 2017-12-25 (×3): 4 mg via INTRAVENOUS
  Filled 2017-12-25 (×3): qty 1

## 2017-12-25 MED ORDER — PIPERACILLIN-TAZOBACTAM 3.375 G IVPB
3.3750 g | Freq: Three times a day (TID) | INTRAVENOUS | Status: DC
  Administered 2017-12-25 – 2017-12-26 (×6): 3.375 g via INTRAVENOUS
  Filled 2017-12-25 (×6): qty 50

## 2017-12-25 MED ORDER — LORAZEPAM 2 MG/ML IJ SOLN
1.0000 mg | Freq: Once | INTRAMUSCULAR | Status: DC
  Administered 2017-12-25: 2 mg via INTRAVENOUS

## 2017-12-25 MED ORDER — PIPERACILLIN-TAZOBACTAM 3.375 G IVPB 30 MIN
3.3750 g | Freq: Once | INTRAVENOUS | Status: AC
  Administered 2017-12-25: 3.375 g via INTRAVENOUS
  Filled 2017-12-25 (×2): qty 50

## 2017-12-25 MED ORDER — VANCOMYCIN HCL IN DEXTROSE 1-5 GM/200ML-% IV SOLN
1000.0000 mg | Freq: Two times a day (BID) | INTRAVENOUS | Status: DC
  Administered 2017-12-25 – 2017-12-26 (×3): 1000 mg via INTRAVENOUS
  Filled 2017-12-25 (×3): qty 200

## 2017-12-25 MED ORDER — FUROSEMIDE 10 MG/ML IJ SOLN
40.0000 mg | Freq: Once | INTRAMUSCULAR | Status: AC
  Administered 2017-12-25: 40 mg via INTRAVENOUS

## 2017-12-25 MED ORDER — VANCOMYCIN HCL 10 G IV SOLR
1500.0000 mg | Freq: Once | INTRAVENOUS | Status: AC
  Administered 2017-12-25: 1500 mg via INTRAVENOUS
  Filled 2017-12-25: qty 1500

## 2017-12-25 MED ORDER — INSULIN ASPART 100 UNIT/ML ~~LOC~~ SOLN
0.0000 [IU] | Freq: Three times a day (TID) | SUBCUTANEOUS | Status: DC
  Administered 2017-12-25 (×2): 3 [IU] via SUBCUTANEOUS

## 2017-12-25 MED ORDER — FUROSEMIDE 10 MG/ML IJ SOLN
INTRAMUSCULAR | Status: AC
  Administered 2017-12-25: 40 mg via INTRAVENOUS
  Filled 2017-12-25: qty 4

## 2017-12-25 MED ORDER — MORPHINE SULFATE (PF) 4 MG/ML IV SOLN
4.0000 mg | Freq: Once | INTRAVENOUS | Status: AC
  Administered 2017-12-25: 4 mg via INTRAVENOUS

## 2017-12-25 MED ORDER — DEXAMETHASONE SODIUM PHOSPHATE 4 MG/ML IJ SOLN
4.0000 mg | Freq: Three times a day (TID) | INTRAMUSCULAR | Status: DC
Start: 2017-12-25 — End: 2017-12-27
  Administered 2017-12-25 – 2017-12-26 (×5): 4 mg via INTRAVENOUS
  Filled 2017-12-25 (×5): qty 1

## 2017-12-25 MED ORDER — POTASSIUM CHLORIDE 10 MEQ/100ML IV SOLN
10.0000 meq | INTRAVENOUS | Status: AC
  Administered 2017-12-25 (×3): 10 meq via INTRAVENOUS
  Filled 2017-12-25 (×3): qty 100

## 2017-12-25 MED ORDER — MORPHINE SULFATE (PF) 4 MG/ML IV SOLN
2.0000 mg | INTRAVENOUS | Status: AC | PRN
  Administered 2017-12-25 – 2017-12-26 (×3): 4 mg via INTRAVENOUS
  Filled 2017-12-25 (×3): qty 1

## 2017-12-25 MED ORDER — LORAZEPAM 2 MG/ML IJ SOLN
1.0000 mg | INTRAMUSCULAR | Status: DC | PRN
  Administered 2017-12-25: 1 mg via INTRAVENOUS
  Filled 2017-12-25: qty 1

## 2017-12-25 MED ORDER — METOPROLOL TARTRATE 5 MG/5ML IV SOLN
5.0000 mg | Freq: Four times a day (QID) | INTRAVENOUS | Status: DC | PRN
  Administered 2017-12-25 – 2017-12-26 (×3): 5 mg via INTRAVENOUS
  Filled 2017-12-25 (×3): qty 5

## 2017-12-25 MED ORDER — LORAZEPAM 2 MG/ML IJ SOLN
1.0000 mg | INTRAMUSCULAR | Status: DC | PRN
  Administered 2017-12-25 – 2017-12-27 (×6): 2 mg via INTRAVENOUS
  Filled 2017-12-25 (×6): qty 1

## 2017-12-25 MED ORDER — MORPHINE SULFATE (PF) 4 MG/ML IV SOLN
INTRAVENOUS | Status: AC
  Administered 2017-12-25: 4 mg via INTRAVENOUS
  Filled 2017-12-25: qty 1

## 2017-12-25 NOTE — Progress Notes (Addendum)
Paged by bedside RN concerning pts 02 sats, Tachycardia, tachypnea and anxiety. Pt desatted on 4Lvia [place] which he then began to refuse oxygen according to the bedside RN. He subsequently desatted into the 60's-70's. He was then placed on a NRB at [place] and was recovering according to [place]. He became acutely anxious and agitated and began to pull off the NRB and writhing in the bed. At this time he was tachycardic into the 160's-170's, hypertensive in the 180's,  and RR was in the 40's. He was given 1mg  of Ativan IV, 5mg  of Metoprolol IV, 40mg  Furosemide IV, an ABG was obtained (results reviewed), and a CXR was obtained as well. He was also placed on Bipap.  Upon entering the room the pt was in acute distress, tachycardic into the 150's, RR 40's, BP was in the 180's-190's and pt was notably anxious pulling at the Bipap and attempting to remove medical lines. He was given another 1mg  of Ativan IV, 5mg  of Metoprolol IV, and foley catheter was placed. Pts anxiety began to subside and vital signs began to stabilize and he was transferred to SDU on the Bipap accompanied by RRT, RRN and C. Dewell Monnier NP. After arrival to the SDU he became acutely anxious, tachycardic into the 140's, hypertensive BP in the 180's, RR 40's. His BS were diminshed greatly on the L and coarse on the R. He was anxious attempting to remove the Bipap and medical equipment. Would follows commands briefly but unable to form sentences due to his SOB. EKG revealed A-flutter. He was given 2mg  of Ativan IV, 5mg  of metoprolol IV, and a repeat dose of furosemide 40mg  IV. Pts VS began to stabilize and he was resting on Bipap upon exiting the room.  CXR revealed a "New development of diffuse airspace disease throughout the right lung likely representing pneumonia. This could represent typical pneumonia versus aspiration pneumonia." Pharmacy was consulted to start the pt on Vancomycin and Zosyn.  Pt is a DNR/DNI. Due to the pts poor overall  prognosis multiple attempts were made to contact the pts son "Squire Withey."   Assessment and Plan 1. Acute hypoxic respiratory failure- continue Bipap.  ABG showing respiratory acidosis CT chest previously showed a lung mass and postobstructive pneumonia Pt is a DNR/DNI.   2. Pneumonia vs Aspiration PNA- New pneumonia seen on CXR throughout the right lung. Pharmacy consulted to start him on Vanc and Zosyn.  3. A-flutter vs ST- EKG revealed A-flutter.  Metoprolol 5mg  IV Q6H PRN for tachycardia and HTN Troponin x1 due to nonspecific ST changes on EKG  4. Anxiety- Ativan 1mg  Q4H PRN Morphine 4mg  Q4H PRN for dyspnea ordered as well  Arby Barrette NP-C, AGPCNP-BC [facility] Pager [phone]  CRITICAL CARE Performed by: Vertis Kelch   Total critical care time:90 minutes  Critical care time was exclusive of separately billable procedures and treating other patients.  Critical care was necessary to treat or prevent imminent or life-threatening deterioration.  Critical care was time spent personally by me on the following activities: development of treatment plan with patient and/or surrogate as well as nursing, discussions with consultants, evaluation of patient's response to treatment, examination of patient, obtaining history from patient or surrogate, ordering and performing treatments and interventions, ordering and review of laboratory studies, ordering and review of radiographic studies, pulse oximetry and re-evaluation of patient's condition.  Addendum: Upon exiting the unit the pt was resting on Bipap. O2 sats 99%, HR 97, RR 25, and BP 188/75. We  will continue to monitor.

## 2017-12-25 NOTE — Consult Note (Signed)
Consultation Note Date: 12/25/2017   Patient Name: Randall Bauer  DOB: [birth year]  MRN: [id number]  Age / Sex: 67 y.o., male  PCP: Patient, No Pcp Per Referring Physician: Marene Lenz, MD  Reason for Consultation: Establishing goals of care and Hospice Evaluation  HPI/Patient Profile: 67 y.o. male  with past medical history of melanoma admitted on 12/13/2017 with lumbar pain and hemoptysis. Unfortunately his CT scans are very concerning for metastatic disease in left lung, T7 vertebral body, left 5th rib, small liver lesion, left adrenal mass, lesion in pancreas, L4 pathologic fracture with tumor extension into left psoas muscle and anterior epidural space of spinal canal, paraspinal muscles, right abd wall muscle, bilat gluteal muscle, left seminal vesicle. There is also concern for brain mets but currently unstable for MRI f/u. Palliative care requested to assist with [place] given extremely poor prognosis.   Clinical Assessment and Goals of Care: I met today with Randall Bauer and his good friend and caregiver, Randall Bauer. Randall Bauer is on BiPAP but able to communicate via writing. He has just received pain medication so he is becoming sleepy. He did want me to call his son and we agreed to set a time to meet.   I called and spoke with his son, Randall Bauer. Randall Bauer is very interested in talking more about "the next step." We discussed how quickly his father has declined and how overwhelming all this has been for them. We discussed that we believe that this is a cancer that appears to be widespread from our diagnostics so far. Randall Bauer understands severity of illness. Randall Bauer says that he did not believe his father understood the severity until today. Randall Bauer says his father asked him earlier today if he was going to die. Randall Bauer is very tearful as he shares this. I explained that in my experience people do often know (sometimes even before  their medical team) when they approach EOL.   Randall Bauer tells me that his father has been clear about his wishes for DNR. He says that he believes his father is worried about dying alone and suffering. Randall Bauer feels his father would benefit from education and reassurance that we can keep him comfortable. Randall Bauer does not feel like his father would ever desire any treatment with chemo/radiation even if he were a candidate. We further discussed the difference in aggressive vs comfort path and even d/c of BiPAP if his father desires or if no improvement. Randall Bauer believes his father's values to be more in line with focus on comfort and QOL.   Randall Bauer wishes to be called at any time with further decline or if his father requests BiPAP to be removed. He says he is only ~15 min away. We agreed to liberalize medication to ensure comfort and to remeet in the morning 0700-0730 to reassess and discuss goals (hopefully Randall Bauer can participate more as well). I shared this all with Randall Bauer who Randall Bauer permits [place] to share information and to "keep in the loop."   Primary Decision Maker PATIENT and then son  SUMMARY OF RECOMMENDATIONS   - DNR confirmed - Comfort focused care - Will further discuss GOC in the am - Cancelled MRI for now as patient unstable for MRI (may reorder if he can tolerate and if [place] align with continued diagnostics)  Code Status/Advance Care Planning:  DNR   Symptom Management:   Pain/SOB: Morphine 2-4 mg IV every 2 hours prn.   Anxiety: Lorazepam 1-2 mg IV every 2 hours prn.   Palliative Prophylaxis:   Aspiration, Bowel Regimen, Delirium Protocol, Frequent Pain Assessment and Oral Care  Additional Recommendations (Limitations, Scope, Preferences):  To be further discussed.   Psycho-social/Spiritual:   Desire for further Chaplaincy support:yes  Additional Recommendations: Caregiving  Support/Resources and Grief/Bereavement Support  Prognosis:   Prognosis appears to be very poor  with continued decline. Concerned he will not survive hospitalization.   Discharge Planning: To Be Determined      Primary Diagnoses: Present on Admission: **None**   I have reviewed the medical record, interviewed the patient and family, and examined the patient. The following aspects are pertinent.  Past Medical History:  Diagnosis Date  . 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 Marital status: Single    Spouse name: None  . Number of children: None  . Years of education: None  . Highest education level: None  Social Needs  . Financial resource strain: None  . Food insecurity - worry: None  . Food insecurity - inability: None  . Transportation needs - medical: None  . Transportation needs - non-medical: None  Occupational History  . None  Tobacco Use  . Smoking status: Current Every Day Smoker    Packs/day: 0.50    Types: Cigarettes  . Smokeless tobacco: Never Used  Substance and Sexual Activity  . Alcohol use: No    Frequency: Never  . Drug use: No  . Sexual activity: None  Other Topics Concern  . None  Social History Narrative  . None   History reviewed. No pertinent family history. Scheduled Meds: . dexamethasone  4 mg Intravenous Q8H  . feeding supplement (ENSURE ENLIVE)  237 mL Oral BID BM  . HYDROcodone-acetaminophen  1 tablet Oral QID  . insulin aspart  0-15 Units Subcutaneous TID WC   Continuous Infusions: . piperacillin-tazobactam (ZOSYN)  IV 3.375 g (12/25/17 1426)  . vancomycin Stopped (12/25/17 1100)   PRN Meds:.acetaminophen **OR** acetaminophen, LORazepam, metoprolol tartrate, morphine injection, ondansetron **OR** ondansetron (ZOFRAN) IV No Known Allergies Review of Systems  Unable to perform ROS: Acuity of condition    Physical Exam  Constitutional: He appears well-developed. He appears lethargic. He has a sickly appearance.  HENT:  Head: Normocephalic and atraumatic.   Cardiovascular: Normal rate and regular rhythm.  Pulmonary/Chest: No accessory muscle usage. No tachypnea. No respiratory distress.  Resting on BiPAP  Abdominal: Soft. Normal appearance.  Neurological: He appears lethargic.  Nursing note and vitals reviewed.   Vital Signs: BP (!) 144/93   Pulse 94   Temp 98 F (36.7 C) (Axillary)   Resp (!) 26   Ht 5' 10"  (1.778 m) Comment: he thinks, he is unable to stand  SpO2 100%   BMI 28.98 kg/m  Pain Assessment: 0-10   Pain Score: 8    SpO2: SpO2: 100 % O2 Device:SpO2: 100 % O2 Flow Rate: .O2 Flow Rate (L/min): 15 L/min  IO: Intake/output summary:   Intake/Output Summary (Last 24 hours) at 12/25/2017 1719 Last  data filed at 12/25/2017 1426 Gross per 24 hour  Intake 510 ml  Output 1325 ml  Net -815 ml    LBM: Last BM Date: 12/20/17 Baseline Weight: Weight: (pt can't straighten legs out to weigh in bed) Most recent weight: Weight: (pt can't straighten legs out to weigh in bed)     Palliative Assessment/Data: 20%    Time Total: 70 min  Greater than 50%  of this time was spent counseling and coordinating care related to the above assessment and plan.  Signed by: Vinie Sill, NP Palliative Medicine Team Pager # [phone] (M-F 8a-5p) Team Phone # [phone] (Nights/Weekends)

## 2017-12-25 NOTE — Progress Notes (Signed)
Rapid Response Event Note  Overview:  RRT called at 2253 to room 1530 for patient in respiratory distress.    Initial Focused Assessment: Pt oxygen saturation 72% on 4LNC, pt leaning over side of bed, RR >30, HR 130s. Pt placed on NRB 15L, and recovered to 100%, I attempted to place pt back on 10LNC. Shortly after placing pt back on nasal cannula, pt quickly became very anxious stating "I can't catch up, I can't catch up" (referring to his breathing). During this time patient HR was 160-175 bpm and RR 40s. I placed pt back on the NRB 15L. Pt lung sounds crackles in all quadrants. Respiratory therapist at bedside, and pt was given breathing treatment. During breathing treatment, pt oxygen saturation as low as 68%. Following breathing treatment pt was placed back on NRB 15L with oxygen saturation improving to the 80s. Pt was very diaphoretic, anxious and fidgety.   Interventions/ Summary: -EKG performed -Chest XR performed -2318- 1mg  Ativan given for anxiety and increased WOB -2329- 5mg  Metoprolol given for HR 150-175 bpm -2330- 40mg  Lasix -2352-  1mg  Ativan given for anxiety and increased WOB -Pt appeared more drowsy after Ativan administration, but remain fidgety and trying to lean over side of bed.  -Foley catheter inserted for aggressive IV diuresing  Plan of Care (if not transferred): Pt transfer to SDU, Rm 1231.  Casimer Bilis

## 2017-12-25 NOTE — Progress Notes (Signed)
  Asked to evaluate for possible Osteocool treatment for lumbar tumor.  MRI with contrast ordered.  If patient is a candidate, will need to arrange with rep. May be toward end of the week before procedure can be done.  Mehgan Santmyer S Melissa Tomaselli PA-C 12/25/2017 8:26 AM

## 2017-12-25 NOTE — Progress Notes (Signed)
   12/24/17 2238  Vitals  Temp 98 F (36.7 C)  Temp Source Oral  BP (!) 153/80  BP Location Right Arm  BP Method Automatic  Patient Position (if appropriate) Lying  Pulse Rate (!) 106  Pulse Rate Source Monitor  Resp (!) 22  Oxygen Therapy  SpO2 (!) 84 %  O2 Device Nasal Cannula  O2 Flow Rate (L/min) 1.5 L/min   Patient c/o dyspnea.  Oxygen increased gradually to 4L/mn.  Noted patient being agitated and anxious while his O2 Sats lowering.  Rapid Response team was called to further evaluate and to treat patient.  The hospitalist was notified.  Orders received and implemented.  The hospitalist came soon after to patient's bedside to evaluate and to treat.  Patient was transferred to 1231.

## 2017-12-25 NOTE — Progress Notes (Signed)
CRITICAL VALUE ALERT  Critical Value:  Troponin 0.09  Date & Time Notied:  12/25/2017 9030  Provider Notified: Bodenheimer  Orders Received/Actions taken: trending troponins

## 2017-12-25 NOTE — Progress Notes (Signed)
Pharmacy Antibiotic Note  Randall Bauer is a 67 y.o. male admitted on 12/17/2017 with sepsis; started on Vanc/Zosyn in ED which was narrowed to Rocephin for PNA. Today with rapid response event for respiratory failure; CXR shows persistent postobstructive PNA in L and new infiltrate in R.  Pharmacy has been consulted to resume vancomycin and Zosyn.  Plan:  Stop Rocephin  Vancomycin 1500 mg IV now, then 1000 mg IV q12 hr (est AUC 423-522 based on SCr rounded to 0.8 or 1.0)  Measure vancomycin AUC at steady state as indicated  Zosyn 3.375 g IV given once over 30 minutes, then every 8 hrs by 4-hr infusion  Follow clinical course, renal function, culture results as available  Follow for de-escalation of antibiotics and LOT   Height: 5\' 10"  (177.8 cm)(he thinks, he is unable to stand) Weight: (pt can't straighten legs out to weigh in bed) IBW/kg (Calculated) : 73  Temp (24hrs), Avg:98 F (36.7 C), Min:97.6 F (36.4 C), Max:98.3 F (36.8 C)  Recent Labs  Lab 11/26/2017 0822 12/04/2017 0846 12/02/2017 1131 12/03/2017 1519 12/23/17 0502 12/24/17 0444  WBC 25.1*  --   --  12.8*  --  11.7*  CREATININE 0.86  --   --  0.64 0.68 0.64  LATICACIDVEN  --  7.05* 1.92*  --   --   --     CrCl cannot be calculated (Unknown ideal weight.).    No Known Allergies  Antimicrobials this admission: 12/29 Vanc>>12/30; resume 1/1 12/29 Zosyn>>12/30; resume 1/1 12/30 CTX >> 12/31  Dose adjustments this admission:  Microbiology results: 12/29BCx:  12/29 MRSA PCR:  Thank you for allowing pharmacy to be a part of this patient's care.  Reuel Boom, PharmD, BCPS Pager: [phone] 12/25/2017, 1:26 AM

## 2017-12-25 NOTE — Progress Notes (Signed)
PT Cancellation Note  Patient Details Name: Randall Bauer MRN: [id number] DOB: 04-11-1951   Cancelled Treatment:    Reason Eval/Treat Not Completed: Medical issues which prohibited therapy(troponin .09 today, will initiate PT once troponins trending down. )   Philomena Doheny 12/25/2017, 7:45 AM [phone]

## 2017-12-25 NOTE — Progress Notes (Addendum)
PROGRESS NOTE    Randall Bauer  PNT:[id number] DOB: 27-Oct-1951 DOA: 11/30/2017 PCP: Patient, No Pcp Per   Brief Narrative: Patient is a 67 year old male with past medical history of squamous cell carcinoma of the face, coronary artery disease, status post STEMI/PCI in 2005, active smoker who presents to the emergency department with weakness and hemoptysis. CT chest showed lung mass and postobstructive pneumonia.  Follow-up CT head and abdomen/pelvis showed brain metastasis , large adrenal metastasis and new  lytic bone lesions,nodes and  small masses in the paraspinal muscles. Patient's general condition worsened last night with development of acute respiratory failure and multifocal pneumonia most likely secondary to aspiration.  Currently patient is on continuous BiPAP.  Assessment & Plan:   Principal Problem:   Acute respiratory failure with hypoxia (HCC) Active Problems:   CAD (coronary artery disease), native coronary artery- with one stent   Lumbar back pain   Collapse of left lung   Lytic bone lesions on xray   Hemoptysis   Adrenal mass (HCC)   Shortness of breath   Anxiety state   Pneumonia of right lung due to infectious organism   Leucocytosis   Elevated troponin  Acute respiratory failure with hypoxia: Secondary to postobstructive pneumonia, left pleural effusion and multifocal pneumonia. Currently on BiPAP.  Pulmonary critical care medicine consulted today.  Patient is DNI/DNR We will continue to monitor his respiratory status.  Might need palliative left-sided thoracocentesis.  We will check with pulmonary recommendation. CXR done last night showed Persistent opacification of left hemithorax. As seen on previous CT scan, this likely represents a combination of mass,postobstructive change, and effusion. New development of diffuse airspace disease throughout the right lung likely representing pneumonia. This could represent typical pneumonia versus aspiration  pneumonia.  Postobstructive pneumonia/ leukocytosis: Lactate was initially elevated more than 7, pro calcitonin elevated.  Lung mass on the left side. Continue broad-spectrum antibiotics.  We will continue to monitor white cell counts  Mass/lytic bone lesion/ Adrenal mass/paraspinal masses/brain mass: S/P IR guided biopsy of the nodule within the subcutaneous tissues of the right inferior lateral flank We will check the biopsy report and call oncology consult . Patient does not have any PCP. Continue dexamethasone for brain mass.We ordered MRI brain.  Currently he may not be stable to go for MRI. CT imaging summary: -4.5 cm left adrenal mass concerning for metastatic disease. -1.8 cm hypoenhancing lesion in the pancreas near the junction of body and tail. Metastatic disease likely although primary pancreatic neoplasm not excluded. -Pathologic fracture at L4 with extra-spinal tumor extension into the left psoas muscle and anterior epidural space of the spinal canal. -Scattered metastatic deposits in the paraspinal muscles, right abdominal wall musculature and bilateral gluteal musculature. -Asymmetric enlargement and enhancement of the left seminal vesicle concerning for metastatic involvement -Hyperdense suprasellar mass measuring 18 x 23 x 14 mm -Findings are worrisome for malignancy throughout the left lung extending into the mediastinum. There is mediastinal and right hilar adenopathy.Left pleural effusion. -Destructive bony metastatic disease including the T7 vertebral body with a pathologic fracture and a destructive process of the left fifth rib.  We will consult palliative care given current poor prognosis.  Lumbar compression fracture: Pathological lytic  Lesion of L4.  IR consulted and following. Pain management as necessary   Hyperglycemia: Likely secondary to dexamethasone.  We will continue sliding-scale insulin  Coronary disease/evelated troponin: Aspirin on hold  due to brain lesion. Mildly elevated troponin most likely due to his supply-demand ischemia.Patient was tachycardiac last  night.  Hyponatremia/hypokalemia: We will continue to monitor the electrolytes level.  K supplemented.  Anemia: Mild.  Likely is related with malignancy.  We will continue to monitor H&H  Generalized weakness: Physical therapy  Consult on place   DVT prophylaxis: Scd Code Status: DNR/DNI Family Communication: Left voice message to the son Disposition Plan: Unknown at this time   Consultants: IR,Pulmonary  Procedures:Biopsy of the right lateral flank  Antimicrobials: Vancomycin and Zosyn since 12/25/17  Subjective: Patient seen and examined the bedside.  Found to be lethargic this morning.  On continuous BiPAP.  Unable to contribute to any communication but arousable on calling his name.  Objective: Vitals:   12/25/17 0730 12/25/17 0743 12/25/17 0800 12/25/17 0836  BP:   126/79   Pulse:   89 86  Resp: (!) 36 (!) 34 (!) 22   Temp:   (!) 97.2 F (36.2 C)   TempSrc:   Axillary   SpO2:   99% 100%  Height:        Intake/Output Summary (Last 24 hours) at 12/25/2017 1021 Last data filed at 12/25/2017 0646 Gross per 24 hour  Intake 630 ml  Output 1525 ml  Net -895 ml   Filed Weights    Examination:  General exam: In moderate respiratory distress, lethargic Respiratory system: Decreased air entry on the left side  cardiovascular system: S1 & S2 heard, RRR. No JVD, murmurs, rubs, gallops or clicks. No pedal edema. Gastrointestinal system: Abdomen is nondistended, soft and nontender. No organomegaly or masses felt. Normal bowel sounds heard. Central nervous system:Lethargic. No focal neurological deficits. Extremities: No edema, no clubbing ,no cyanosis, distal peripheral pulses palpable. Skin: No rashes, lesions or ulcers,no icterus ,no pallor Psychiatry: Not examined    Data Reviewed: I have personally reviewed following labs and imaging  studies  CBC: Recent Labs  Lab 11/29/2017 0822 12/13/2017 1519 12/24/17 0444 12/25/17 0332  WBC 25.1* 12.8* 11.7* 21.3*  NEUTROABS 18.7*  --   --  18.9*  HGB 12.5* 10.1* 9.0* 10.2*  HCT 36.7* 29.8* 26.7* 31.8*  MCV 82.7 83.0 83.7 84.8  PLT 428* 220 197 409   Basic Metabolic Panel: Recent Labs  Lab 12/08/2017 0822 12/02/2017 1519 12/23/17 0502 12/24/17 0444 12/25/17 0332  NA 130*  --  133* 132* 135  K 3.0*  --  3.8 3.5 3.0*  CL 89*  --  100* 97* 95*  CO2 22  --  25 28 29   GLUCOSE 169*  --  164* 159* 221*  BUN 16  --  17 23* 28*  CREATININE 0.86 0.64 0.68 0.64 0.94  CALCIUM 9.4  --  8.6* 9.0 8.5*  MG  --   --   --   --  1.8   GFR: CrCl cannot be calculated (Unknown ideal weight.). Liver Function Tests: Recent Labs  Lab 12/21/2017 0822 12/23/17 0502  AST 46* 26  ALT 34 26  ALKPHOS 123 91  BILITOT 1.3* 0.7  PROT 7.5 5.6*  ALBUMIN 2.5* 1.9*   No results for input(s): LIPASE, AMYLASE in the last 168 hours. No results for input(s): AMMONIA in the last 168 hours. Coagulation Profile: Recent Labs  Lab 11/28/2017 0937  INR 1.29   Cardiac Enzymes: Recent Labs  Lab 12/17/2017 0937 12/25/17 0332  TROPONINI <0.03 0.09*   BNP (last 3 results) No results for input(s): PROBNP in the last 8760 hours. HbA1C: No results for input(s): HGBA1C in the last 72 hours. CBG: Recent Labs  Lab 12/25/17 1016  GLUCAP  157*   Lipid Profile: No results for input(s): CHOL, HDL, LDLCALC, TRIG, CHOLHDL, LDLDIRECT in the last 72 hours. Thyroid Function Tests: No results for input(s): TSH, T4TOTAL, FREET4, T3FREE, THYROIDAB in the last 72 hours. Anemia Panel: No results for input(s): VITAMINB12, FOLATE, FERRITIN, TIBC, IRON, RETICCTPCT in the last 72 hours. Sepsis Labs: Recent Labs  Lab 12/02/2017 0846 11/28/2017 1131 12/12/2017 1519 12/23/17 0502 2018-01-17 0444  PROCALCITON  --   --  0.83 0.66 0.51  LATICACIDVEN 7.05* 1.92*  --   --   --     Recent Results (from the past 240 hour(s))   Blood Culture (routine x 2)     Status: None (Preliminary result)   Collection Time: 12/05/2017  9:37 AM  Result Value Ref Range Status   Specimen Description BLOOD LEFT ANTECUBITAL  Final   Special Requests   Final    BOTTLES DRAWN AEROBIC AND ANAEROBIC Blood Culture adequate volume   Culture   Final    NO GROWTH 2 DAYS Performed at [facility], [place] [address], [place], [place] [address]    Report Status PENDING  Incomplete  Blood Culture (routine x 2)     Status: None (Preliminary result)   Collection Time: 11/26/2017  9:43 AM  Result Value Ref Range Status   Specimen Description BLOOD LEFT ANTECUBITAL  Final   Special Requests   Final    BOTTLES DRAWN AEROBIC AND ANAEROBIC Blood Culture adequate volume   Culture   Final    NO GROWTH 2 DAYS Performed at [facility], [place] [address], [place], [place] [address]    Report Status PENDING  Incomplete         Radiology Studies: Ct Biopsy  Result Date: 2018-01-17 INDICATION: History of squamous cell carcinoma of the phase, now with CT findings worrisome for advanced multifocal metastatic disease. Please perform CT-guided biopsy of indeterminate nodule within the subcutaneous tissues of the right lateral flank for tissue diagnostic purposes. EXAM: CT-GUIDED BIOPSY OF SUBCUTANEOUS NODULE WITHIN THE RIGHT FLANK COMPARISON:  CT the chest, abdomen and pelvis - 12/08/2017 MEDICATIONS: None. ANESTHESIA/SEDATION: None CONTRAST:  None. COMPLICATIONS: None immediate. PROCEDURE: Informed consent was obtained from the patient following an explanation of the procedure, risks, benefits and alternatives. A time out was performed prior to the initiation of the procedure. The patient was positioned left lateral decubitus on the CT table and a limited CT was performed for procedural planning demonstrating unchanged appearance of the approximately 1.7 x 1.4 cm nodule within the subcutaneous tissues about the posterolateral aspect the right flank  (image 40, series 2). The procedure was planned. The operative site was prepped and draped in the usual sterile fashion. Appropriate trajectory was confirmed with a 22 gauge spinal needle after the adjacent tissues were anesthetized with 1% Lidocaine with epinephrine. Under intermittent CT guidance, a 17 gauge coaxial needle was advanced into the peripheral aspect of the mass. Appropriate positioning was confirmed and 6 core needle biopsy samples were obtained with an 18 gauge core needle biopsy device. The co-axial needle was removed and hemostasis was achieved with manual compression. A limited postprocedural CT was negative for hemorrhage or additional complication. A dressing was placed. The patient tolerated the procedure well without immediate postprocedural complication. IMPRESSION: Technically successful CT guided core needle biopsy of indeterminate nodule within the subcutaneous tissues of the posterolateral aspect of the right flank. Electronically Signed   By: Sandi Mariscal M.D.   On: 17-Jan-2018 11:47   Dg Chest  Port 1 View  Result Date: 12/24/2017 CLINICAL DATA:  Shortness of breath. EXAM: PORTABLE CHEST 1 VIEW COMPARISON:  CT chest and chest radiograph 12/16/2017 FINDINGS: Persistent diffuse opacification of the left hemithorax. This corresponds to probable large mass and postobstructive change with pleural effusion as seen on CT. There is interval development of diffuse airspace disease throughout the right lung. Rapid development of this process suggest most likely an infectious process. Consider typical pneumonia versus aspiration pneumonia. No pneumothorax. Heart size is obscured by the left pulmonary process. IMPRESSION: 1. Persistent opacification of left hemithorax. As seen on previous CT scan, this likely represents a combination of mass, postobstructive change, and effusion. 2. New development of diffuse airspace disease throughout the right lung likely representing pneumonia. This could  represent typical pneumonia versus aspiration pneumonia. Electronically Signed   By: Lucienne Capers M.D.   On: 12/24/2017 23:40        Scheduled Meds: . dexamethasone  4 mg Oral Q8H  . feeding supplement (ENSURE ENLIVE)  237 mL Oral BID BM  . HYDROcodone-acetaminophen  1 tablet Oral QID  . insulin aspart  0-15 Units Subcutaneous TID WC  . ketorolac  30 mg Intravenous Q6H   Continuous Infusions: . piperacillin-tazobactam (ZOSYN)  IV 3.375 g (12/25/17 0518)  . potassium chloride Stopped (12/25/17 1016)  . vancomycin 1,000 mg (12/25/17 0941)     LOS: 3 days    Time spent: 25 mins    Carolynne Schuchard Jodie Echevaria, MD [facility] Pager [phone]  If 7PM-7AM, please contact night-coverage [email or web address] Password [email or web address] 12/25/2017, 10:21 AM

## 2017-12-25 NOTE — Plan of Care (Signed)
  Not Progressing Health Behavior/Discharge Planning: Ability to manage health-related needs will improve 12/25/2017 1422 - Not Progressing by Staci Righter, RN Clinical Measurements: Ability to maintain clinical measurements within normal limits will improve 12/25/2017 1422 - Not Progressing by Staci Righter, RN Diagnostic test results will improve 12/25/2017 1422 - Not Progressing by Staci Righter, RN Activity: Risk for activity intolerance will decrease 12/25/2017 1422 - Not Progressing by Staci Righter, RN

## 2017-12-25 NOTE — Consult Note (Signed)
PULMONARY / CRITICAL CARE MEDICINE   Name: Randall Bauer MRN: [id number] DOB: [birth year]    ADMISSION DATE:  12/02/2017 CONSULTATION DATE: 12/25/2017   CHIEF COMPLAINT:  Dyspnea.  HISTORY OF PRESENT ILLNESS:   Randall Bauer is known to the pulmonary service.  Randall Bauer presented to this hospital on 1230 complaining of hemoptysis and dyspnea accompanied by substantial weight loss.  Randall Bauer was found to have a very abnormal chest x-ray with complete opacification of the left chest.  Multiple CT scans showed the presence of mediastinal masses with obstruction of the late left mainstem bronchus.  Randall Bauer also had multiple metastases to bones soft tissue in the suprasellar region.  A biopsy of 1 of the rib lesions was performed on 1231.  Early this morning had difficulties with desaturation tachypnea and anxiousness.  A repeat chest x-ray showed persistent opacification of the left chest but now there was infiltration of the right lung as well.  Randall Bauer was started empirically on vancomycin and Zosyn and was given Lasix and Ativan as well.  Randall Bauer continues to wear BiPAP and indicates to me that Randall Bauer is now breathing comfortably and will be able to sleep with the BiPAP overnight.  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  PAST SURGICAL HISTORY: Randall Bauer  has a past surgical history that includes melanoma removal.  No Known Allergies  No current facility-administered medications on file prior to encounter.    Current Outpatient Medications on File Prior to Encounter  Medication Sig  . ibuprofen (ADVIL,MOTRIN) 800 MG tablet Take 800 mg by mouth every 8 (eight) hours as needed for mild pain or moderate pain.  . naproxen (NAPROSYN) 500 MG tablet Take 1 tablet (500 mg total) by mouth 2 (two) times daily as needed for mild pain or moderate pain. With meals  . oxyCODONE-acetaminophen (ROXICET) 5-325 MG tablet Take 1 tablet by mouth every 8 (eight) hours as  needed for severe pain.  . predniSONE (DELTASONE) 20 MG tablet TK 3 TS PO TOGETHER AS ONE DOSE FOR 5 DAYS    FAMILY HISTORY:  His has no family status information on file.    SOCIAL HISTORY: Randall Bauer  reports that Randall Bauer has been smoking cigarettes.  Randall Bauer has been smoking about 0.50 packs per day. Randall Bauer has never used smokeless tobacco. Randall Bauer reports that Randall Bauer does not drink alcohol or use drugs.  REVIEW OF SYSTEMS:   Unobtainable  SUBJECTIVE:  As above  VITAL SIGNS: BP (!) 144/93   Pulse 94   Temp 98 F (36.7 C) (Axillary)   Resp (!) 26   Ht 5\' 10"  (1.778 m) Comment: Randall Bauer thinks, Randall Bauer is unable to stand  SpO2 100%   BMI 28.98 kg/m   HEMODYNAMICS:    VENTILATOR SETTINGS: Vent Mode: PCV;BIPAP FiO2 (%):  [80 %-100 %] 80 % Set Rate:  [10 bmp] 10 bmp PEEP:  [6 cmH20] 6 cmH20  INTAKE / OUTPUT: I/O last 3 completed shifts: In: 7829 [P.O.:1320; I.V.:50; IV Piggyback:100] Out: 3050 [Urine:3050]  PHYSICAL EXAMINATION: General: Cachectic and chronically ill-appearing breathing comfortably on BiPAP Neuro: Nodding appropriately to questions and following instructions, using all fours Cardiovascular: S1 and S2 are regular with a honking 3 out of 6 systolic ejection murmur Lungs: There is decreased air movement throughout the left hemithorax and the left hemithorax is dull to percussion.  There are scattered rhonchi on the right and no wheezes. Abdomen: The abdomen is soft  without any overt organomegaly masses tenderness guarding or rebound   LABS:  BMET Recent Labs  Lab 12/23/17 0502 12/24/17 0444 12/25/17 0332  NA 133* 132* 135  K 3.8 3.5 3.0*  CL 100* 97* 95*  CO2 25 28 29   BUN 17 23* 28*  CREATININE 0.68 0.64 0.94  GLUCOSE 164* 159* 221*    Electrolytes Recent Labs  Lab 12/23/17 0502 12/24/17 0444 12/25/17 0332  CALCIUM 8.6* 9.0 8.5*  MG  --   --  1.8    CBC Recent Labs  Lab 12/10/2017 1519 12/24/17 0444 12/25/17 0332  WBC 12.8* 11.7* 21.3*  HGB 10.1* 9.0* 10.2*  HCT  29.8* 26.7* 31.8*  PLT 220 197 338    Coag's Recent Labs  Lab 12/07/2017 0937  INR 1.29    Sepsis Markers Recent Labs  Lab 12/07/2017 0846 11/28/2017 1131 12/21/2017 1519 12/23/17 0502 12/24/17 0444  LATICACIDVEN 7.05* 1.92*  --   --   --   PROCALCITON  --   --  0.83 0.66 0.51    ABG Recent Labs  Lab 12/24/17 2330  PHART 7.259*  PCO2ART 43.8  PO2ART 112*    Liver Enzymes Recent Labs  Lab 12/12/2017 0822 12/23/17 0502  AST 46* 26  ALT 34 26  ALKPHOS 123 91  BILITOT 1.3* 0.7  ALBUMIN 2.5* 1.9*    Cardiac Enzymes Recent Labs  Lab 11/30/2017 0937 12/25/17 0332  TROPONINI <0.03 0.09*    Glucose Recent Labs  Lab 12/25/17 1016 12/25/17 1206 12/25/17 1535  GLUCAP 157* 160* 116*    Imaging Dg Chest Port 1 View  Result Date: 12/24/2017 CLINICAL DATA:  Shortness of breath. EXAM: PORTABLE CHEST 1 VIEW COMPARISON:  CT chest and chest radiograph 12/21/2017 FINDINGS: Persistent diffuse opacification of the left hemithorax. This corresponds to probable large mass and postobstructive change with pleural effusion as seen on CT. There is interval development of diffuse airspace disease throughout the right lung. Rapid development of this process suggest most likely an infectious process. Consider typical pneumonia versus aspiration pneumonia. No pneumothorax. Heart size is obscured by the left pulmonary process. IMPRESSION: 1. Persistent opacification of left hemithorax. As seen on previous CT scan, this likely represents a combination of mass, postobstructive change, and effusion. 2. New development of diffuse airspace disease throughout the right lung likely representing pneumonia. This could represent typical pneumonia versus aspiration pneumonia. Electronically Signed   By: Lucienne Capers M.D.   On: 12/24/2017 23:40      ANTIBIOTICS: Zosyn and vancomycin started on 1/1    DISCUSSION: This is a 67 year old with a history of substantial weight loss who presented with  hemoptysis.  Randall Bauer was found to have a mass obstructing the left mainstem bronchus with complete collapse of the left lung.  Imaging studies suggested multiple metastatic lesions including to the brain soft tissue and bones.  A biopsy of 1 of the rib lesions was obtained on 12/31.  Last night Randall Bauer had increasing dyspnea and Randall Bauer has developed a infiltrate in the right lung.  I agree with your coverage for potential aspiration with vancomycin and Zosyn.  Hopefully we will have results of the biopsy tomorrow morning.  I have explained to him that if we do not have a diagnostic biopsy result, Randall Bauer would require intubation in order to perform bronchoscopy to obtain diagnostic material.  Randall Bauer is contemplating whether or not Randall Bauer would desire that option.  In the interim Randall Bauer is comfortable breathing on BiPAP.  Lars Masson, MD Pulmonary and  [place] Pager: [phone]  12/25/2017, 6:17 PM

## 2017-12-25 DEATH — deceased

## 2017-12-26 LAB — CBC WITH DIFFERENTIAL/PLATELET
BASOS ABS: 0 10*3/uL (ref 0.0–0.1)
BASOS PCT: 0 %
EOS PCT: 0 %
Eosinophils Absolute: 0 10*3/uL (ref 0.0–0.7)
HCT: 30.2 % — ABNORMAL LOW (ref 39.0–52.0)
Hemoglobin: 9.9 g/dL — ABNORMAL LOW (ref 13.0–17.0)
Lymphocytes Relative: 9 %
Lymphs Abs: 1.3 10*3/uL (ref 0.7–4.0)
MCH: 28 pg (ref 26.0–34.0)
MCHC: 32.8 g/dL (ref 30.0–36.0)
MCV: 85.3 fL (ref 78.0–100.0)
MONO ABS: 0.7 10*3/uL (ref 0.1–1.0)
Monocytes Relative: 5 %
Neutro Abs: 12.1 10*3/uL — ABNORMAL HIGH (ref 1.7–7.7)
Neutrophils Relative %: 86 %
PLATELETS: 260 10*3/uL (ref 150–400)
RBC: 3.54 MIL/uL — ABNORMAL LOW (ref 4.22–5.81)
RDW: 15 % (ref 11.5–15.5)
WBC: 14.1 10*3/uL — ABNORMAL HIGH (ref 4.0–10.5)

## 2017-12-26 LAB — BASIC METABOLIC PANEL
Anion gap: 10 (ref 5–15)
BUN: 23 mg/dL — AB (ref 6–20)
CALCIUM: 8.5 mg/dL — AB (ref 8.9–10.3)
CO2: 27 mmol/L (ref 22–32)
CREATININE: 0.75 mg/dL (ref 0.61–1.24)
Chloride: 98 mmol/L — ABNORMAL LOW (ref 101–111)
GFR calc Af Amer: >60 mL/min (ref >60)
GLUCOSE: 173 mg/dL — AB (ref 65–99)
Potassium: 4.4 mmol/L (ref 3.5–5.1)
Sodium: 135 mmol/L (ref 135–145)

## 2017-12-26 LAB — TROPONIN I: Troponin I: 0.08 ng/mL (ref <0.03)

## 2017-12-26 MED ORDER — SODIUM CHLORIDE 0.9 % IV SOLN
2.0000 mg/h | INTRAVENOUS | Status: DC
  Administered 2017-12-26: 2 mg/h via INTRAVENOUS
  Filled 2017-12-26: qty 10

## 2017-12-26 MED ORDER — POLYVINYL ALCOHOL 1.4 % OP SOLN: 1.0000 [drp] | Freq: Four times a day (QID) | OPHTHALMIC | Status: DC | PRN

## 2017-12-26 MED ORDER — MORPHINE BOLUS VIA INFUSION
2.0000 mg | INTRAVENOUS | Status: DC | PRN
  Administered 2017-12-26 – 2017-12-27 (×4): 2 mg via INTRAVENOUS
  Administered 2017-12-27: 4 mg via INTRAVENOUS
  Filled 2017-12-26: qty 4

## 2017-12-26 MED ORDER — BIOTENE DRY MOUTH MT LIQD: 15.0000 mL | OROMUCOSAL | Status: DC | PRN

## 2017-12-26 MED ORDER — GLYCOPYRROLATE 0.2 MG/ML IJ SOLN
0.2000 mg | INTRAMUSCULAR | Status: DC
  Administered 2017-12-26 – 2017-12-27 (×5): 0.2 mg via INTRAVENOUS
  Filled 2017-12-26 (×4): qty 1

## 2017-12-26 MED ORDER — GLYCOPYRROLATE 0.2 MG/ML IJ SOLN: 0.2000 mg | INTRAMUSCULAR | Status: DC | PRN

## 2017-12-26 NOTE — Progress Notes (Signed)
Nutrition Brief Note  Pt identified on the malnutrition screening tool.  Chart reviewed. Pt now transitioning to comfort care.  No further nutrition interventions warranted at this time.  Please re-consult as needed.   Parks Ranger, MS, RDN, LDN 12/26/2017 9:40 AM

## 2017-12-26 NOTE — Progress Notes (Signed)
Palliative:  Discussed with patient and family plan for potential BiPAP d/c. Will d/c as patient desires or when we is unable to continue interacting with his family. Discussed with patient that we can remove BiPAP but I anticipate he will not be able to breathe on his own for long and also explained that we do not believe that the BiPAP is going to make him better. Reassured him that either way we will make sure he is not in pain or struggling to breathe. Anticipate he will need morphine infusion increased to 4 mg/hr and will need liberal morphine bolus 4 mg (or more) along with ativan 2 mg bolus at least 30 minutes prior to removing BiPAP. Do not remove BiPAP without family present.   Vinie Sill, NP Palliative Medicine Team Pager # [phone] (M-F 8a-5p) Team Phone # [phone] (Nights/Weekends)

## 2017-12-26 NOTE — Progress Notes (Signed)
Daily Progress Note   Patient Name: Randall Bauer       Date: 12/26/2017 DOB: 28-Nov-1951  Age: 67 y.o. MRN#: [id number] Attending Physician: Theodis Blaze, MD Primary Care Physician: Patient, No Pcp Per Admit Date: 11/28/2017  Reason for Consultation/Follow-up: Establishing goals of care and Hospice Evaluation  Subjective: Mr. Shamoon is lethargic this morning. Appears comfortable on BiPAP currently but was reportedly very uncomfortable overnight with SOB, anxiety, and tachycardia. Did not tolerate mouthcare.   Length of Stay: 4  Current Medications: Scheduled Meds:  . dexamethasone  4 mg Intravenous Q8H    Continuous Infusions: . morphine    . piperacillin-tazobactam (ZOSYN)  IV 3.375 g (12/26/17 0502)  . vancomycin Stopped (12/25/17 2228)    PRN Meds: acetaminophen **OR** acetaminophen, antiseptic oral rinse, glycopyrrolate, LORazepam, metoprolol tartrate, morphine injection, morphine, ondansetron **OR** ondansetron (ZOFRAN) IV, polyvinyl alcohol  Physical Exam         Constitutional: He appears well-developed. He appears lethargic. He has a sickly appearance.  HENT:  Head: Normocephalic and atraumatic.  Cardiovascular: Normal rate and regular rhythm.  Pulmonary/Chest: No accessory muscle usage. No tachypnea. No respiratory distress.  Resting on BiPAP  Abdominal: Soft. Normal appearance.  Neurological: He appears lethargic.  Nursing note and vitals reviewed.   Vital Signs: BP (!) 91/43 (BP Location: Left Arm)   Pulse (!) 32   Temp 97.7 F (36.5 C) (Axillary)   Resp (!) 30   Ht 5' 10"  (1.778 m) Comment: he thinks, he is unable to stand  SpO2 (!) 89%   BMI 28.98 kg/m  SpO2: SpO2: (!) 89 % O2 Device: O2 Device: Bi-PAP O2 Flow Rate: O2 Flow Rate (L/min): 15  L/min  Intake/output summary:   Intake/Output Summary (Last 24 hours) at 12/26/2017 0755 Last data filed at 12/26/2017 0600 Gross per 24 hour  Intake 660 ml  Output 1170 ml  Net -510 ml   LBM: Last BM Date: 12/20/17 Baseline Weight: Weight: (pt can't straighten legs out to weigh in bed) Most recent weight: Weight: (pt can't straighten legs out to weigh in bed)       Palliative Assessment/Data: 20%      Patient Active Problem List   Diagnosis Date Noted  . Leucocytosis 12/25/2017  . Elevated troponin 12/25/2017  . Acute respiratory failure with hypoxia ([place])   .  Shortness of breath   . Anxiety state   . Pneumonia of right lung due to infectious organism   . Metastatic cancer ([place])   . Goals of care, counseling/discussion   . Palliative care encounter   . CAD (coronary artery disease), native coronary artery- with one stent 12/01/2017  . Lumbar back pain 12/10/2017  . Collapse of left lung 12/01/2017  . Lytic bone lesions on xray 12/16/2017  . Hemoptysis 12/12/2017  . Adrenal mass ([place]) 12/14/2017    Palliative Care Assessment & Plan   HPI: 67 y.o. male  with past medical history of melanoma admitted on 12/05/2017 with lumbar pain and hemoptysis. Unfortunately his CT scans are very concerning for metastatic disease in left lung, T7 vertebral body, left 5th rib, small liver lesion, left adrenal mass, lesion in pancreas, L4 pathologic fracture with tumor extension into left psoas muscle and anterior epidural space of spinal canal, paraspinal muscles, right abd wall muscle, bilat gluteal muscle, left seminal vesicle. There is also concern for brain mets but currently unstable for MRI f/u. Palliative care requested to assist with [place] given extremely poor prognosis.    Assessment: I met this morning with son, [name]. Ivor Reining is also at bedside and we were joined by Eddie's wife. Mr. Aguila has just now become more comfortable. He had a horrible night last night and per night  RN he did not have much relief from morphine or Ativan doses. I spoke more with Eddie right outside the room at his request. [name] is very much interested in more comfort care for his father. He feels he is suffering and does not feel he will be able to improve - I agree with this. He does not desire any further testing/diagnostics. We discussed the best way to keep his father comfortable is with a morphine drip and he agrees and understands this may make his father more lethargic and he may not awaken. [name] would rather see his father sleeping and comfortable than how he has been the past ~24 hours. We also discussed that once Mr. Tesfaye is comfortable there will likely be a time when that BiPAP will come off either from Mr. Prout wishes or from the fact it will eventually be prolonging him at EOL. Eddie seems fully prepared to proceed with this when the time is right. Emotional support provided. All questions/concerns addressed. We will continue to assess use/benefit of BiPAP this afternoon and tomorrow.   Recommendations/Plan:  Pain/dyspnea: Morphine 2 mg/hr infusion - may be titrated up as needed to achieve comfort if boluses is ineffective. Bolus via infusion 2-4 mg every 30 min prn.   Anxiety: Ativan 1-2 mg IV every 2 hours prn.   Secretions: Robinul 0.2 mg IV every 4 hours prn.   Will continue antibiotics for now. This has been very sudden for Mr. Panas and his family. Allow them time to process.   Goals of Care and Additional Recommendations:  Limitations on Scope of Treatment: Full Comfort Care  Code Status:  DNR  Prognosis:   Hours - Days  Discharge Planning:  Anticipated Hospital Death  Care plan was discussed with Dr. Doyle Askew, RN.   Thank you for allowing the Palliative Medicine Team to assist in the care of this patient.   Total Time 40 min Prolonged Time Billed  no       Greater than 50%  of this time was spent counseling and coordinating care related to the above  assessment and plan.  Vinie Sill, NP  Palliative Medicine Team Pager # [phone] (M-F 8a-5p) Team Phone # [phone] (Nights/Weekends)

## 2017-12-26 NOTE — Progress Notes (Signed)
Attempted to do oral care for pt. But when mask was removed he began panicking. Pox 94% hr elevated to 140's  Unable to do cares mouth was very dry and cracked. Gave prn as ordered.  RR increased in 40's  Tried to reassure pt that his oxygen level was good and to relax.

## 2017-12-26 NOTE — Progress Notes (Addendum)
PROGRESS NOTE   Randall Bauer  UVO:[id number] DOB: Mar 16, 1951 DOA: 11/29/2017   PCP: Patient, No Pcp Per   Brief Narrative: Patient is a 67 year old male with past medical history of squamous cell carcinoma of the face, coronary artery disease, status post STEMI/PCI in 2005, active smoker who presented to the emergency department with weakness and hemoptysis. CT chest showed lung mass and postobstructive pneumonia.  Follow-up CT head and abdomen/pelvis showed brain metastasis, large adrenal metastasis and new  lytic bone lesions, nodes and  small masses in the paraspinal muscles. Patient's general condition worsened night 1/1 with development of acute respiratory failure and multifocal pneumonia most likely secondary to aspiration.  Currently patient is on continuous BiPAP.  Assessment & Plan:   Acute respiratory failure with hypoxia: Secondary to mass obstructing left main bronchus with complete collapse of the left lung, left pleural effusion and right lung PNA, ? Aspiration  - pt is currently on BiPAP and did not tolerated trial of tapering off BiPAP - pt placed on Zosyn and vancomycin, will stop vanc today as MRSA PCR negative, continue Zosyn - PCCM was consulted and explained that pt will need to be intubated if bronchoscopy is to be done - after discussion with palliative care team, pt and family leaning more towards comfort care - plan is to keep on BiPAP for now, allow analgesia and morphine drip as needed - attempt to wean off BiPAP to allow desired comfort   Postobstructive pneumonia/ leukocytosis: Lactate was initially elevated more than 7, pro calcitonin elevated.  Lung mass on the left side. - as noted above, will stop vancomycin and will continue Zosyn for now - WBC is trending down  Mass/lytic bone lesion/ Adrenal mass/paraspinal masses/brain mass: - S/P IR guided biopsy of the nodule within the subcutaneous tissues of the right inferior lateral flank, results pending  -  Continue dexamethasone for brain mass. - pt not stable for MRI brain  - focus is more on comfort at this point   Lumbar compression fracture: - Pathological lytic  Lesion of L4.   - analgesia as needed   Hyperglycemia:  - Likely secondary to dexamethasone.    Coronary disease/evelated troponin: Aspirin on hold due to brain lesion. - Mildly elevated troponin most likely due to his supply-demand ischemia - no chest pain this AM  Hyponatremia/hypokalemia - Na and K stable and WNL this AM   Anemia: Mild.  Likely is related with malignancy - no evidence of active bleeding  Generalized weakness - allow rest per comfort measures   DVT prophylaxis: Scd Code Status: DNR/DNI Family Communication: family at bedside  Disposition Plan: Unknown at this time   Consultants:   IR  Pulmonary  PCCM  Procedures:  Biopsy of the right lateral flank  Antimicrobials:   Vancomycin 12/25/17 --> 12/26/17  Zosyn 12/25/17 -->  Subjective: Pt reports having rough night, very dry lips and hopes to have BiPAP taken off soon.   Objective: Vitals:   12/26/17 0750 12/26/17 0800 12/26/17 1044 12/26/17 1150  BP:  113/66    Pulse:      Resp:  (!) 25 20   Temp: (!) 97.4 F (36.3 C)     TempSrc: Axillary     SpO2:  90%  99%  Weight:      Height:   5\' 10"  (1.778 m)     Intake/Output Summary (Last 24 hours) at 12/26/2017 1320 Last data filed at 12/26/2017 1200 Gross per 24 hour  Intake 868 ml  Output  1290 ml  Net -422 ml   Filed Weights   12/06/2017 1046  Weight: 84.7 kg (186 lb 11.7 oz)    Physical Exam  Constitutional: Appears tired and in mild distress due to dyspnea, on BiPAP CVS: RRR, S1/S2 +, no murmurs, no gallops, no carotid bruit.  Pulmonary: diminished breath sounds bilaterally with rhonchi on the right side  Abdominal: Soft. BS +,  no distension, tenderness, rebound or guarding.  Musculoskeletal: Normal range of motion. No edema and no tenderness.  Neuro: Alert. Moving all 4  extremities, following commands   Data Reviewed: I have personally reviewed following blood work and imaging studies   CBC: Recent Labs  Lab 12/18/2017 0822 12/05/2017 1519 12/24/17 0444 12/25/17 0332 12/26/17 0338  WBC 25.1* 12.8* 11.7* 21.3* 14.1*  NEUTROABS 18.7*  --   --  18.9* 12.1*  HGB 12.5* 10.1* 9.0* 10.2* 9.9*  HCT 36.7* 29.8* 26.7* 31.8* 30.2*  MCV 82.7 83.0 83.7 84.8 85.3  PLT 428* 220 197 338 824   Basic Metabolic Panel: Recent Labs  Lab 12/16/2017 0822 12/13/2017 1519 12/23/17 0502 12/24/17 0444 12/25/17 0332 12/26/17 0338  NA 130*  --  133* 132* 135 135  K 3.0*  --  3.8 3.5 3.0* 4.4  CL 89*  --  100* 97* 95* 98*  CO2 22  --  25 28 29 27   GLUCOSE 169*  --  164* 159* 221* 173*  BUN 16  --  17 23* 28* 23*  CREATININE 0.86 0.64 0.68 0.64 0.94 0.75  CALCIUM 9.4  --  8.6* 9.0 8.5* 8.5*  MG  --   --   --   --  1.8  --    Liver Function Tests: Recent Labs  Lab 12/21/2017 0822 12/23/17 0502  AST 46* 26  ALT 34 26  ALKPHOS 123 91  BILITOT 1.3* 0.7  PROT 7.5 5.6*  ALBUMIN 2.5* 1.9*   Coagulation Profile: Recent Labs  Lab 12/20/2017 0937  INR 1.29   Cardiac Enzymes: Recent Labs  Lab 11/26/2017 0937 12/25/17 0332 12/26/17 0338  TROPONINI <0.03 0.09* 0.08*   CBG: Recent Labs  Lab 12/25/17 1016 12/25/17 1206 12/25/17 1535 12/25/17 2120  GLUCAP 157* 160* 116* 142*   Sepsis Labs: Recent Labs  Lab 12/04/2017 0846 12/21/2017 1131 12/21/2017 1519 12/23/17 0502 12/24/17 0444  PROCALCITON  --   --  0.83 0.66 0.51  LATICACIDVEN 7.05* 1.92*  --   --   --     Recent Results (from the past 240 hour(s))  Blood Culture (routine x 2)     Status: None (Preliminary result)   Collection Time: 12/24/2017  9:37 AM  Result Value Ref Range Status   Specimen Description BLOOD LEFT ANTECUBITAL  Final   Special Requests   Final    BOTTLES DRAWN AEROBIC AND ANAEROBIC Blood Culture adequate volume   Culture   Final    NO GROWTH 3 DAYS Performed at [facility], [place] [address] [place], [place] [address]    Report Status PENDING  Incomplete  Blood Culture (routine x 2)     Status: None (Preliminary result)   Collection Time: 12/07/2017  9:43 AM  Result Value Ref Range Status   Specimen Description BLOOD LEFT ANTECUBITAL  Final   Special Requests   Final    BOTTLES DRAWN AEROBIC AND ANAEROBIC Blood Culture adequate volume   Culture   Final    NO GROWTH 3 DAYS Performed at [facility], [place]  [address], [place], [place] [address]    Report Status PENDING  Incomplete  MRSA PCR Screening     Status: None   Collection Time: 12/25/17 10:28 AM  Result Value Ref Range Status   MRSA by PCR NEGATIVE NEGATIVE Final    Comment:        The GeneXpert MRSA Assay (FDA approved for NASAL specimens only), is one component of a comprehensive MRSA colonization surveillance program. It is not intended to diagnose MRSA infection nor to guide or monitor treatment for MRSA infections.      Radiology Studies: Dg Chest Port 1 View  Result Date: 12/24/2017 CLINICAL DATA:  Shortness of breath. EXAM: PORTABLE CHEST 1 VIEW COMPARISON:  CT chest and chest radiograph 11/26/2017 FINDINGS: Persistent diffuse opacification of the left hemithorax. This corresponds to probable large mass and postobstructive change with pleural effusion as seen on CT. There is interval development of diffuse airspace disease throughout the right lung. Rapid development of this process suggest most likely an infectious process. Consider typical pneumonia versus aspiration pneumonia. No pneumothorax. Heart size is obscured by the left pulmonary process. IMPRESSION: 1. Persistent opacification of left hemithorax. As seen on previous CT scan, this likely represents a combination of mass, postobstructive change, and effusion. 2. New development of diffuse airspace disease throughout the right lung likely representing pneumonia. This could represent typical pneumonia versus aspiration pneumonia.  Electronically Signed   By: Lucienne Capers M.D.   On: 12/24/2017 23:40   Scheduled Meds: . dexamethasone  4 mg Intravenous Q8H   Continuous Infusions: . morphine 2 mg/hr (12/26/17 1200)  . piperacillin-tazobactam (ZOSYN)  IV Stopped (12/26/17 1115)     LOS: 4 days   Time spent: 25 minutes   Faye Ramsay, MD [facility] Pager [phone]  If 7PM-7AM, please contact night-coverage [email or web address] Password [email or web address] 12/26/2017, 1:20 PM

## 2017-12-26 NOTE — Progress Notes (Signed)
   12/26/17 1100  Clinical Encounter Type  Visited With Family  Visit Type Initial;Psychological support;Spiritual support;Critical Care  Referral From Nurse  Consult/Referral To Chaplain  Spiritual Encounters  Spiritual Needs Emotional;Other (Comment) (Spiritual Care conversation/support)  Stress Factors  Patient Stress Factors Not reviewed  Family Stress Factors Major life changes;Health changes;Other (Comment) (Relationship with patient )   I visited with the patient's significant other per referral from the nurse.  The patient's significant other stated that she is [place], but that the patient is not a spiritual person.  I walked her through life review of her relationship with the patient. This seemed to bring her comfort.  There were no pressing needs at this time.   Please, contact Spiritual Care for further assistance.   [place] M.Div.

## 2017-12-27 LAB — CBC
HEMATOCRIT: 31.4 % — AB (ref 39.0–52.0)
HEMOGLOBIN: 10 g/dL — AB (ref 13.0–17.0)
MCH: 27.1 pg (ref 26.0–34.0)
MCHC: 31.8 g/dL (ref 30.0–36.0)
MCV: 85.1 fL (ref 78.0–100.0)
Platelets: 247 10*3/uL (ref 150–400)
RBC: 3.69 MIL/uL — AB (ref 4.22–5.81)
RDW: 15.3 % (ref 11.5–15.5)
WBC: 14.3 10*3/uL — ABNORMAL HIGH (ref 4.0–10.5)

## 2017-12-27 LAB — CULTURE, BLOOD (ROUTINE X 2)
CULTURE: NO GROWTH
CULTURE: NO GROWTH
SPECIAL REQUESTS: ADEQUATE
Special Requests: ADEQUATE

## 2017-12-27 LAB — BASIC METABOLIC PANEL
Anion gap: 8 (ref 5–15)
BUN: 24 mg/dL — ABNORMAL HIGH (ref 6–20)
CHLORIDE: 101 mmol/L (ref 101–111)
CO2: 29 mmol/L (ref 22–32)
Calcium: 8.8 mg/dL — ABNORMAL LOW (ref 8.9–10.3)
Creatinine, Ser: 0.82 mg/dL (ref 0.61–1.24)
GFR calc non Af Amer: >60 mL/min (ref >60)
Glucose, Bld: 137 mg/dL — ABNORMAL HIGH (ref 65–99)
Potassium: 4 mmol/L (ref 3.5–5.1)
SODIUM: 138 mmol/L (ref 135–145)

## 2018-01-25 NOTE — Progress Notes (Signed)
Pt pulled BIPAP mask off and stated that he was done with it.  Pt placed on 100% NRB, Pt resting comfortably at this time. RT to monitor and assess as needed.

## 2018-01-25 NOTE — Discharge Summary (Signed)
Death Summary  Breylon Sherrow YBO:[id number] DOB: [birth year] DOA: 01-15-18  PCP: Patient, No Pcp Per  Admit date: 01/15/2018 Date of Death: 01-20-2018 Notification: Patient, No Pcp Per   Brief Narrative: Patient is a 67 year old male with past medical history of squamous cell carcinoma of the face, coronary artery disease, status post STEMI/PCI in 2005, active smoker who presented to the emergency department with weakness and hemoptysis. CT chest showed lung mass and postobstructive pneumonia.  Follow-up CT head and abdomen/pelvis showed brain metastasis, large adrenal metastasis and new  lytic bone lesions, nodes and  small masses in the paraspinal muscles. Patient's general condition worsened night 1/1 with development of acute respiratory failure and multifocal pneumonia most likely secondary to aspiration.  Currently patient is on continuous BiPAP.  Assessment & Plan:   Acute respiratory failure with hypoxia: Secondary to mass obstructing left main bronchus with complete collapse of the left lung, left pleural effusion and right lung PNA, ? Aspiration  - pt placed on Zosyn and vancomycin, also on BiPAP but continued to decline  - PCCM was consulted and explained that pt will need to be intubated if bronchoscopy is to be done - after discussion with palliative care team, pt and family lwant to focus on comfort care - comfort allowed   Postobstructive pneumonia/ leukocytosis: Lactate was initially elevated more than 7, pro calcitonin elevated.  Lung mass on the left side.  Mass/lytic bone lesion/ Adrenal mass/paraspinal masses/brain mass: - S/P IR guided biopsy of the nodule within the subcutaneous tissues of the right inferior lateral flank, results pending  - no MRI done as pt not stable  - focus on comfort   Lumbar compression fracture: - Pathological lytic  Lesion of L4.    Hyperglycemia:  - Likely secondary to dexamethasone.    Coronary disease/evelated troponin: Aspirin  on hold due to brain lesion. - Mildly elevated troponin most likely due to his supply-demand ischemia  Hyponatremia/hypokalemia - Na and K stable  Anemia: Mild.  Likely is related with malignancy - no evidence of active bleeding  Generalized weakness - allowed rest per comfort measures   DVT prophylaxis: Scd Code Status: DNR/DNI Family Communication: family at bedside    Consultants:   IR  Pulmonary  PCCM  Procedures:  Biopsy of the right lateral flank  Antimicrobials:   Vancomycin 12/25/17 --> 12/26/17  Zosyn 12/25/17 -->   The results of significant diagnostics from this hospitalization (including imaging, microbiology, ancillary and laboratory) are listed below for reference.    Significant Diagnostic Studies: Dg Chest 2 View  Result Date: 01-15-18 CLINICAL DATA:  Worsening back pain and shortness of breath. EXAM: CHEST  2 VIEW COMPARISON:  None. FINDINGS: Two-view exam shows complete whiteout of the left hemithorax with evidence of some associated left hemithoracic volume loss. Extreme right lung apex not included on the film, but visualized portions of the right lung are clear. The visualized bony structures of the thorax are intact. IMPRESSION: Complete opacification of the left hemithorax with some associated volume loss. Central obstructing lesion with pleural effusion a distinct concern. Dedicated CT chest with IV contrast recommended to further evaluate. Electronically Signed   By: Misty Stanley M.D.   On: 01-15-18 09:48   Ct Head Wo Contrast  Addendum Date: 2018/01/15   ADDENDUM REPORT: 01-15-2018 15:52 ADDENDUM: These results were called by telephone at the time of interpretation on 01-15-18 at 3:52 pm to Dr. Debbe Odea , who verbally acknowledged these results. Electronically Signed   By: Juanda Crumble  Carlis Abbott M.D.   On: 12/08/2017 15:52   Result Date: 12/05/2017 CLINICAL DATA:  Non-small-cell lung cancer staging. History of melanoma. EXAM: CT HEAD  WITHOUT CONTRAST TECHNIQUE: Contiguous axial images were obtained from the base of the skull through the vertex without intravenous contrast. COMPARISON:  None. FINDINGS: Brain: Hyperdense suprasellar mass lesion measures 18 x 23 x 14 mm. It is difficult determine if this extends into the sella. The sella is not enlarged. The mass is heterogeneous in density but predominantly hyperdense. No associated calcifications. There is compression of the optic chiasm. No definite invasion of the cavernous sinus. Generalized atrophy. Chronic microvascular ischemic changes in the white matter. Negative for acute ischemic infarction Vascular: Negative for hyperdense vessel. Atherosclerotic calcification. Skull: Negative Sinuses/Orbits: Mild mucosal edema left maxillary sinus. Normal orbit. Other: None IMPRESSION: Hyperdense suprasellar mass measuring 18 x 23 x 14 mm. It is difficult determine if this extends into the sella. Differential diagnosis includes pituitary macro adenoma, possibly with hemorrhage. Giant aneurysm in the differential. Meningioma, Rathke's cleft cyst, and craniopharyngioma in the differential. Metastatic disease, possibly with hemorrhage also in the differential. Further evaluation with MRI of brain without with contrast suggested for further evaluation. Electronically Signed: By: Franchot Gallo M.D. On: 12/01/2017 14:21   Ct Angio Chest Pe W/cm &/or Wo Cm  Result Date: 12/24/2017 CLINICAL DATA:  Hemoptysis EXAM: CT ANGIOGRAPHY CHEST WITH CONTRAST TECHNIQUE: Multidetector CT imaging of the chest was performed using the standard protocol during bolus administration of intravenous contrast. Multiplanar CT image reconstructions and MIPs were obtained to evaluate the vascular anatomy. CONTRAST:  139mL ISOVUE-370 IOPAMIDOL (ISOVUE-370) INJECTION 76% COMPARISON:  CT abdomen and pelvis 01/27/2005 FINDINGS: Cardiovascular: There are no filling defects in the pulmonary arterial tree to suggest acute pulmonary  thromboembolism. The main and right pulmonary artery are normal in caliber. There is severe narrowing of the left main pulmonary artery secondary to suspected malignancy. There are no opacified pulmonary arterial branches in the mid and lower left lung. Aortic atherosclerotic calcifications are noted. No evidence of aortic aneurysm or dissection. Atherosclerotic calcification and irregular soft plaque is evident in the descending thoracic aorta and upper abdominal aorta. Three vessel coronary artery calcification. Atherosclerotic calcifications at the origin of the right subclavian artery. Mediastinum/Nodes: Abnormal mediastinal adenopathy is worrisome for malignancy. 2.0 cm precarinal node. 1.9 cm right hilar node. 5.3 cm subcarinal mass is continuous with mass effect throughout the left lung. Several smaller prevascular nodes to the left of and anterior to the aortic arch. Unremarkable thyroid. The midesophagus becomes inconspicuous and inseparable from the subcarinal mass. See image 46 of series 4. Lungs/Pleura: The entire left lung is opacified and abnormal in appearance. There are no air bronchograms. Minimal pulmonary arterial branches in the mid and lower left lung. These findings are worrisome for diffuse malignancy throughout the left lung. The left mainstem bronchus is occluded 3 cm beyond its origin. Left upper and lower lobe lobar airways are occluded. Small left pleural effusion. No pneumothorax. Scattered moderate emphysema within the right upper lobe. Upper Abdomen: Large left adrenal mass has increased in size since the prior study. There is heterogeneous enhancement and calcification. Maximal diameter is 4.0 cm. 9 mm anterior right lobe liver lesion towards the dome on image 83 is larger than on the prior study. Musculoskeletal: T7 wedge compression deformity is present. There are lytic areas within the bone worrisome for pathologic fracture. Minimal retropulsion of the mid posterior wall is 3 mm.  Destructive lytic lesion in the left posterior  fifth rib. Review of the MIP images confirms the above findings. IMPRESSION: No evidence of acute pulmonary thromboembolism Findings are worrisome for malignancy throughout the left lung extending into the mediastinum. There is mediastinal and right hilar adenopathy. Left pleural effusion. 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 Needle biopsy of the left fifth rib lesion should provide diagnosis with relatively low risk. Left adrenal mass has enlarged. Small liver lesion has enlarged. Aortic Atherosclerosis (ICD10-I70.0) and Emphysema (ICD10-J43.9). Electronically Signed   By: Marybelle Killings M.D.   On: 12/23/2017 10:21   Ct Abdomen Pelvis W Contrast  Result Date: 11/25/2017 CLINICAL DATA:  Squamous cell skin cancer with left flank and back pain. EXAM: CT ABDOMEN AND PELVIS WITH CONTRAST TECHNIQUE: Multidetector CT imaging of the abdomen and pelvis was performed using the standard protocol following bolus administration of intravenous contrast. CONTRAST:  58mL ISOVUE-300 IOPAMIDOL (ISOVUE-300) INJECTION 61% COMPARISON:  Noncontrast CT abdomen and pelvis from 01/27/2005. FINDINGS: Lower chest: Complex left pleural effusion with abnormal enhancement in the left infrahilar region and left lung better demonstrated on recent CT chest. Hepatobiliary: 9 mm hypoattenuating lesion in the anterior dome of liver is not fully characterize. Liver parenchyma otherwise unremarkable. No evidence for calcified gallstones. No intrahepatic or extrahepatic biliary dilation. Pancreas: 1.8 cm hypoattenuating lesion is identified in the pancreas near the junction of the body and tail. No dilatation of the main pancreatic duct. Spleen: No splenomegaly. No focal mass lesion. Adrenals/Urinary Tract: Right adrenal gland unremarkable. 4.5 cm left adrenal mass shows heterogeneous enhancement  and cannot be characterized as adenoma. 8 mm low-density lesion upper pole right kidney is likely a cyst. No suspicious abnormality in the left kidney. No evidence for hydroureter. The urinary bladder appears normal for the degree of distention. Stomach/Bowel: Stomach is nondistended. No gastric wall thickening. No evidence of outlet obstruction. Duodenum is normally positioned as is the ligament of Treitz. No small bowel wall thickening. No small bowel dilatation. The terminal ileum is normal. The appendix is normal. No gross colonic mass. No colonic wall thickening. No substantial diverticular change. Vascular/Lymphatic: There is abdominal aortic atherosclerosis without aneurysm. There is no gastrohepatic or hepatoduodenal ligament lymphadenopathy. No intraperitoneal or retroperitoneal lymphadenopathy. 9 mm short axis left para-aortic lymph node is seen on image 44 of series 2. No pelvic sidewall lymphadenopathy. Reproductive: Asymmetric enlargement and enhancement identified in the left seminal vesicle. Other: No intraperitoneal free fluid. 9 mm retroperitoneal nodule identified just cranial to the right kidney. 10 mm retroperitoneal nodule identified along the left psoas muscle on image 56 series 2. Musculoskeletal: Pathologic fracture noted in the L4 vertebral body with evidence of paraspinal tumor extending into the left psoas muscle. Tumor extension into the anterior epidural space is visible on image 53 of series 2. 14 mm enhancing lesion in the left gluteal muscles on image 67 is consistent with metastatic disease. There is a similar 11 mm enhancing lesion in the right gluteus musculature on the same image. 19 mm enhancing inferior right gluteal lesion is seen on image 103. 14 mm lesion identified posterior right abdominal wall musculature on image 50. Enhancing lesions are identified in the paraspinal musculature on images 60, 23, and 16. IMPRESSION: 1. Abnormal soft tissue in the left hemithorax with left  pleural effusion better characterized on CT scan of the chest performed earlier today. 2. 4.5 cm left adrenal mass concerning for metastatic disease. 3. 1.8 cm hypoenhancing lesion  in the pancreas near the junction of body and tail. Metastatic disease likely although primary pancreatic neoplasm not excluded. 4. Pathologic fracture at L4 with extra-spinal tumor extension into the left psoas muscle and anterior epidural space of the spinal canal. 5. Scattered metastatic deposits in the paraspinal muscles, right abdominal wall musculature and bilateral gluteal musculature. 6. Asymmetric enlargement and enhancement of the left seminal vesicle concerning for metastatic involvement. Electronically Signed   By: Misty Stanley M.D.   On: 12/24/2017 14:21   Ct Biopsy  Result Date: 12/24/2017 INDICATION: History of squamous cell carcinoma of the phase, now with CT findings worrisome for advanced multifocal metastatic disease. Please perform CT-guided biopsy of indeterminate nodule within the subcutaneous tissues of the right lateral flank for tissue diagnostic purposes. EXAM: CT-GUIDED BIOPSY OF SUBCUTANEOUS NODULE WITHIN THE RIGHT FLANK COMPARISON:  CT the chest, abdomen and pelvis - 12/14/2017 MEDICATIONS: None. ANESTHESIA/SEDATION: None CONTRAST:  None. COMPLICATIONS: None immediate. PROCEDURE: Informed consent was obtained from the patient following an explanation of the procedure, risks, benefits and alternatives. A time out was performed prior to the initiation of the procedure. The patient was positioned left lateral decubitus on the CT table and a limited CT was performed for procedural planning demonstrating unchanged appearance of the approximately 1.7 x 1.4 cm nodule within the subcutaneous tissues about the posterolateral aspect the right flank (image 40, series 2). The procedure was planned. The operative site was prepped and draped in the usual sterile fashion. Appropriate trajectory was confirmed with a 22  gauge spinal needle after the adjacent tissues were anesthetized with 1% Lidocaine with epinephrine. Under intermittent CT guidance, a 17 gauge coaxial needle was advanced into the peripheral aspect of the mass. Appropriate positioning was confirmed and 6 core needle biopsy samples were obtained with an 18 gauge core needle biopsy device. The co-axial needle was removed and hemostasis was achieved with manual compression. A limited postprocedural CT was negative for hemorrhage or additional complication. A dressing was placed. The patient tolerated the procedure well without immediate postprocedural complication. IMPRESSION: Technically successful CT guided core needle biopsy of indeterminate nodule within the subcutaneous tissues of the posterolateral aspect of the right flank. Electronically Signed   By: Sandi Mariscal M.D.   On: 12/24/2017 11:47   Dg Chest Port 1 View  Result Date: 12/24/2017 CLINICAL DATA:  Shortness of breath. EXAM: PORTABLE CHEST 1 VIEW COMPARISON:  CT chest and chest radiograph 12/23/2017 FINDINGS: Persistent diffuse opacification of the left hemithorax. This corresponds to probable large mass and postobstructive change with pleural effusion as seen on CT. There is interval development of diffuse airspace disease throughout the right lung. Rapid development of this process suggest most likely an infectious process. Consider typical pneumonia versus aspiration pneumonia. No pneumothorax. Heart size is obscured by the left pulmonary process. IMPRESSION: 1. Persistent opacification of left hemithorax. As seen on previous CT scan, this likely represents a combination of mass, postobstructive change, and effusion. 2. New development of diffuse airspace disease throughout the right lung likely representing pneumonia. This could represent typical pneumonia versus aspiration pneumonia. Electronically Signed   By: Lucienne Capers M.D.   On: 12/24/2017 23:40    Microbiology: Recent Results (from  the past 240 hour(s))  Blood Culture (routine x 2)     Status: None   Collection Time: 11/29/2017  9:37 AM  Result Value Ref Range Status   Specimen Description BLOOD LEFT ANTECUBITAL  Final   Special Requests   Final    BOTTLES  DRAWN AEROBIC AND ANAEROBIC Blood Culture adequate volume   Culture   Final    NO GROWTH 5 DAYS Performed at [facility], [place] [address], [place], [place] [address]    Report Status 2018-01-06 FINAL  Final  Blood Culture (routine x 2)     Status: None   Collection Time: 12/23/2017  9:43 AM  Result Value Ref Range Status   Specimen Description BLOOD LEFT ANTECUBITAL  Final   Special Requests   Final    BOTTLES DRAWN AEROBIC AND ANAEROBIC Blood Culture adequate volume   Culture   Final    NO GROWTH 5 DAYS Performed at [facility], [place] [address] [place], [place] [address]    Report Status 2018/01/06 FINAL  Final  MRSA PCR Screening     Status: None   Collection Time: 12/25/17 10:28 AM  Result Value Ref Range Status   MRSA by PCR NEGATIVE NEGATIVE Final    Comment:        The GeneXpert MRSA Assay (FDA approved for NASAL specimens only), is one component of a comprehensive MRSA colonization surveillance program. It is not intended to diagnose MRSA infection nor to guide or monitor treatment for MRSA infections.      Labs: Basic Metabolic Panel: Recent Labs  Lab 12/24/17 0444 12/25/17 0332 12/26/17 0338 01/06/18 0322  NA 132* 135 135 138  K 3.5 3.0* 4.4 4.0  CL 97* 95* 98* 101  CO2 28 29 27 29   GLUCOSE 159* 221* 173* 137*  BUN 23* 28* 23* 24*  CREATININE 0.64 0.94 0.75 0.82  CALCIUM 9.0 8.5* 8.5* 8.8*  MG  --  1.8  --   --    CBC: Recent Labs  Lab 12/24/17 0444 12/25/17 0332 12/26/17 0338 Jan 06, 2018 0322  WBC 11.7* 21.3* 14.1* 14.3*  NEUTROABS  --  18.9* 12.1*  --   HGB 9.0* 10.2* 9.9* 10.0*  HCT 26.7* 31.8* 30.2* 31.4*  MCV 83.7 84.8 85.3 85.1  PLT 197 338 260 247   Cardiac Enzymes: Recent Labs  Lab 12/25/17 0332  12/26/17 0338  TROPONINI 0.09* 0.08*   CBG: Recent Labs  Lab 12/25/17 1016 12/25/17 1206 12/25/17 1535 12/25/17 2120  GLUCAP 157* 160* 116* 142*   Urinalysis    Component Value Date/Time   COLORURINE AMBER (A) 11/26/2017 1116   APPEARANCEUR CLOUDY (A) 12/16/2017 1116   LABSPEC 1.036 (H) 12/21/2017 1116   PHURINE 6.0 12/08/2017 1116   GLUCOSEU 50 (A) 12/10/2017 1116   HGBUR SMALL (A) 12/12/2017 1116   BILIRUBINUR NEGATIVE 12/09/2017 1116   KETONESUR 20 (A) 12/24/2017 1116   PROTEINUR 30 (A) 11/28/2017 1116   UROBILINOGEN 1.0 03/29/2010 1629   NITRITE NEGATIVE 11/28/2017 1116   LEUKOCYTESUR SMALL (A) 11/24/2017 1116    SIGNED:  Faye Ramsay, MD  [facility] 12/30/2017, 8:21 AM Pager [phone]  If 7PM-7AM, please contact night-coverage [email or web address] Password [email or web address]

## 2018-01-25 NOTE — Progress Notes (Addendum)
Pt currently refusing BiPAP at this time. Pt did allow RN to place him on 15L NRB. I notified on-call for [facility], regarding pt refusing BiPAP and also left a message with Palliative Care Team. Pt primary RN, Apolonio Schneiders., at bedside as well as pt son.

## 2018-01-25 NOTE — Progress Notes (Signed)
RN paged because pt had taken his BIPAP mask off and was refusing to wear it any longer. O2 sat down to 44%. Family at bedside and aware that removal of bipap will result in death. Pt had been seen by palliative care and made a DNR with comfort as the focus of treatment. Pt admitted with acute respiratory failure and found to have a lung mass and post obstructive PNA. Also, has lytic bone lesion and brain mass s/p bx with results pending. Pt himself made decision earlier in stay with palliative to be the one to remove bipap when he was tired and ready.  NP arrived as pt in PEA arrest. Quickly, went to asystole and without any respirations. Pt's wife and son at bedside understandably upset and crying but at peace with decision to stop the bipap, stating he had "had enough". Pt pronounced expired at 0434. Death certificate completely and given to [place], Therapist, sports.  KJKG, NP [facility]

## 2018-01-25 NOTE — Progress Notes (Signed)
Pt expired at [place] NP here to pronounce.  Family at bedside.  All personal belongings sent with son [name]. Post care performed with lines and tubes removed.  Pt is potential eye donor. Eye prep performed.  Transport here for body.

## 2018-01-25 DEATH — deceased

## 2018-02-12 IMAGING — CT CT BIOPSY
1 of 4 series · 8 of 32 positions shown, 13 images · non-contrast
Comparison: CT the chest, abdomen and pelvis - 12/22/2017

INDICATION: History of squamous cell carcinoma of the phase, now with CT
findings worrisome for advanced multifocal metastatic disease.
Please perform CT-guided biopsy of indeterminate nodule within the
subcutaneous tissues of the right lateral flank for tissue
diagnostic purposes.

EXAM:
CT-GUIDED BIOPSY OF SUBCUTANEOUS NODULE WITHIN THE RIGHT FLANK

[Series 2: i-spiral 5.0 b31f · axial · 0.91mm/px · z∈[-558,-404]mm · 8 of 58 slices shown, 13 images]
[im 7/58  soft-tissue]
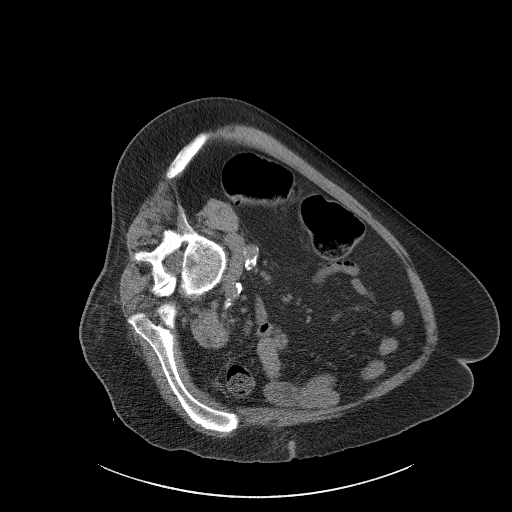
[im 7/58  bone]
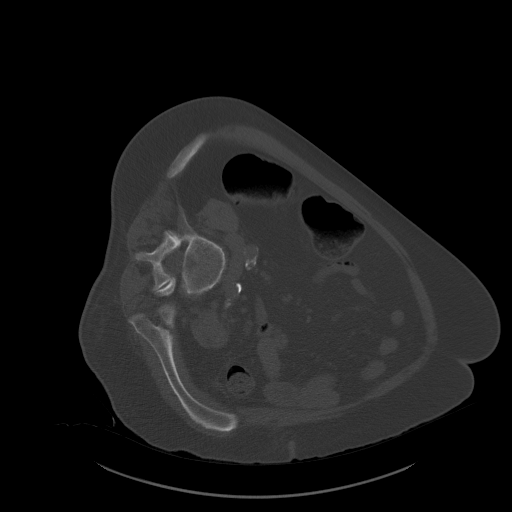
[im 13/58  soft-tissue]
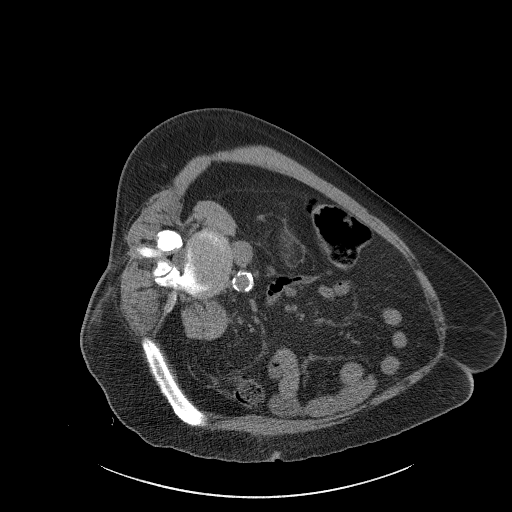
[im 20/58  soft-tissue]
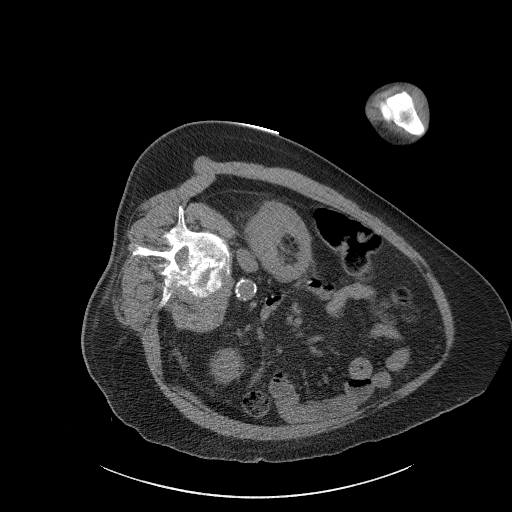
[im 26/58  soft-tissue]
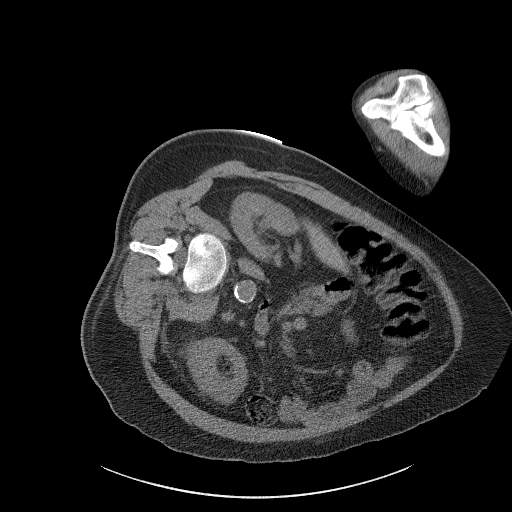
[im 32/58  soft-tissue]
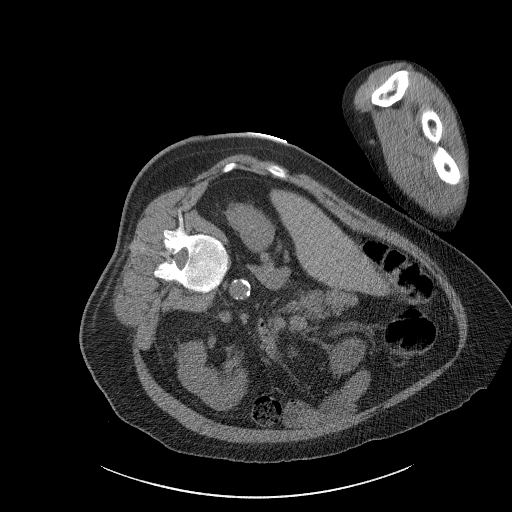
[im 32/58  lung]
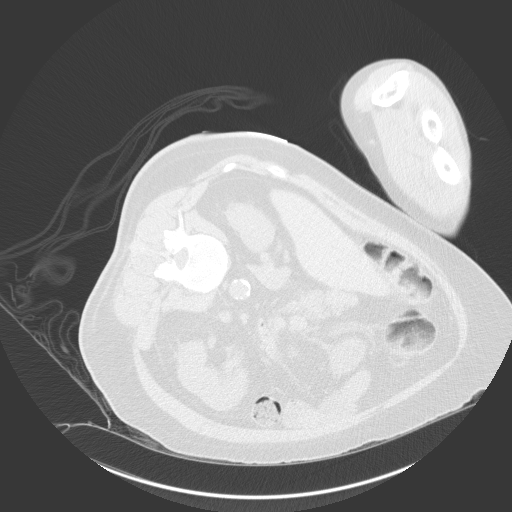
[im 39/58  soft-tissue]
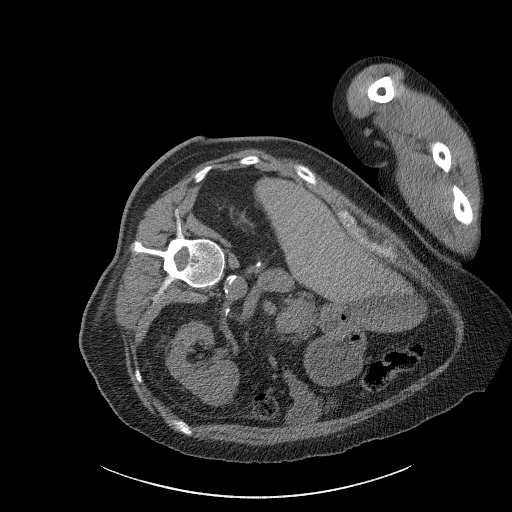
[im 39/58  lung]
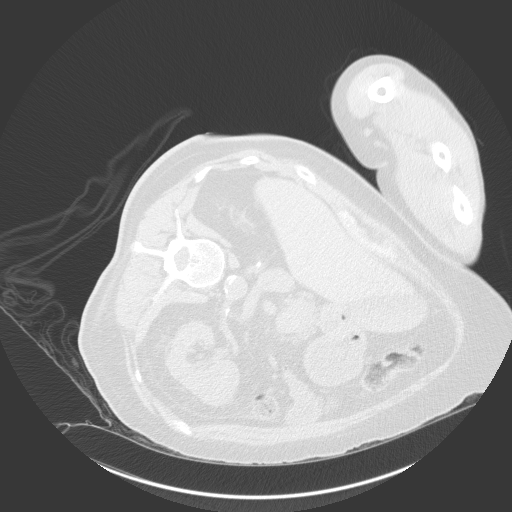
[im 45/58  soft-tissue]
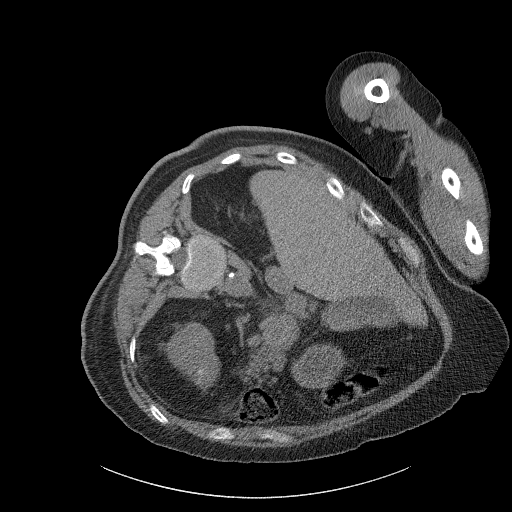
[im 45/58  lung]
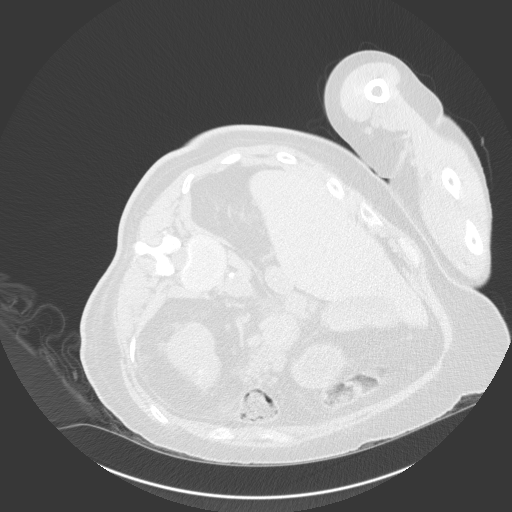
[im 51/58  soft-tissue]
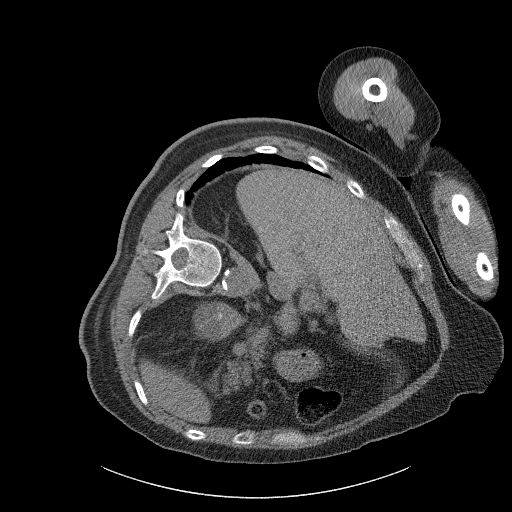
[im 51/58  lung]
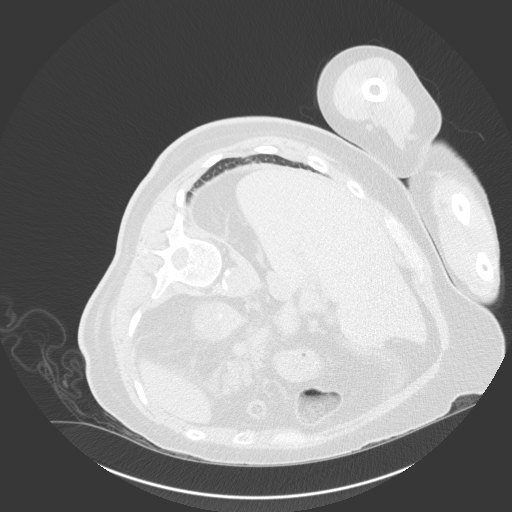

[8 of 32 positions shown; findings below may reference images not displayed]

MEDICATIONS:
None.

ANESTHESIA/SEDATION:
None

CONTRAST:  None.

COMPLICATIONS:
None immediate.

PROCEDURE:
Informed consent was obtained from the patient following an
explanation of the procedure, risks, benefits and alternatives. A
time out was performed prior to the initiation of the procedure.

The patient was positioned left lateral decubitus on the CT table
and a limited CT was performed for procedural planning demonstrating
unchanged appearance of the approximately 1.7 x 1.4 cm nodule within
the subcutaneous tissues about the posterolateral aspect the right
flank (image 40, series 2). The procedure was planned. The operative
site was prepped and draped in the usual sterile fashion.
Appropriate trajectory was confirmed with a 22 gauge spinal needle
after the adjacent tissues were anesthetized with 1% Lidocaine with
epinephrine.

Under intermittent CT guidance, a 17 gauge coaxial needle was
advanced into the peripheral aspect of the mass.

Appropriate positioning was confirmed and 6 core needle biopsy
samples were obtained with an 18 gauge core needle biopsy device.
The co-axial needle was removed and hemostasis was achieved with
manual compression.

A limited postprocedural CT was negative for hemorrhage or
additional complication. A dressing was placed. The patient
tolerated the procedure well without immediate postprocedural
complication.
IMPRESSION: Technically successful CT guided core needle biopsy of indeterminate
nodule within the subcutaneous tissues of the posterolateral aspect
of the right flank.
